# Patient Record
Sex: Male | Born: 1970 | Race: White | Hispanic: No | Marital: Married | Smoking: Former smoker
Health system: Southern US, Community
[De-identification: ages and names within clinical notes are randomized; demographics above are authoritative.]

## PROBLEM LIST (undated history)

## (undated) DIAGNOSIS — E785 Hyperlipidemia, unspecified: Secondary | ICD-10-CM

## (undated) DIAGNOSIS — N529 Male erectile dysfunction, unspecified: Secondary | ICD-10-CM

## (undated) DIAGNOSIS — H269 Unspecified cataract: Secondary | ICD-10-CM

## (undated) HISTORY — DX: Male erectile dysfunction, unspecified: N52.9

## (undated) HISTORY — DX: Unspecified cataract: H26.9

## (undated) HISTORY — DX: Hyperlipidemia, unspecified: E78.5

## (undated) HISTORY — PX: WISDOM TOOTH EXTRACTION: SHX21

## (undated) HISTORY — PX: VASECTOMY: SHX75

## (undated) HISTORY — PX: EYE SURGERY: SHX253

---

## 2003-07-03 ENCOUNTER — Encounter: Admission: RE | Admit: 2003-07-03 | Discharge: 2003-07-03 | Payer: Self-pay | Admitting: Gastroenterology

## 2004-07-09 ENCOUNTER — Encounter: Admission: RE | Admit: 2004-07-09 | Discharge: 2004-07-09 | Payer: Self-pay | Admitting: Thoracic Surgery

## 2004-07-17 ENCOUNTER — Ambulatory Visit (HOSPITAL_COMMUNITY): Admission: RE | Admit: 2004-07-17 | Discharge: 2004-07-17 | Payer: Self-pay | Admitting: Thoracic Surgery

## 2004-08-14 ENCOUNTER — Ambulatory Visit (HOSPITAL_COMMUNITY): Admission: RE | Admit: 2004-08-14 | Discharge: 2004-08-14 | Payer: Self-pay | Admitting: Thoracic Surgery

## 2004-09-17 ENCOUNTER — Ambulatory Visit (HOSPITAL_COMMUNITY): Admission: RE | Admit: 2004-09-17 | Discharge: 2004-09-17 | Payer: Self-pay | Admitting: Gastroenterology

## 2005-02-10 ENCOUNTER — Encounter: Admission: RE | Admit: 2005-02-10 | Discharge: 2005-02-10 | Payer: Self-pay | Admitting: Gastroenterology

## 2006-05-18 HISTORY — PX: ESOPHAGEAL DILATION: SHX303

## 2007-01-17 IMAGING — RF DG ESOPHAGUS
8 of 10 series · 14 of 24 positions shown · non-contrast
Comparison: 07/03/03.

CLINICAL DATA: Dysphagia/history of stricture. 
BARIUM SWALLOW:

[Series 1: run · 1 of 1 slices shown (1 of 8)]
[im 1/1]
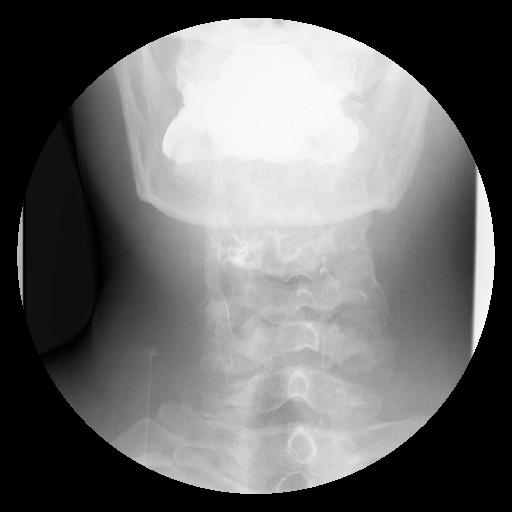

[Series 2: run · 2 of 8 slices shown (2 of 8)]
[im 3/8]
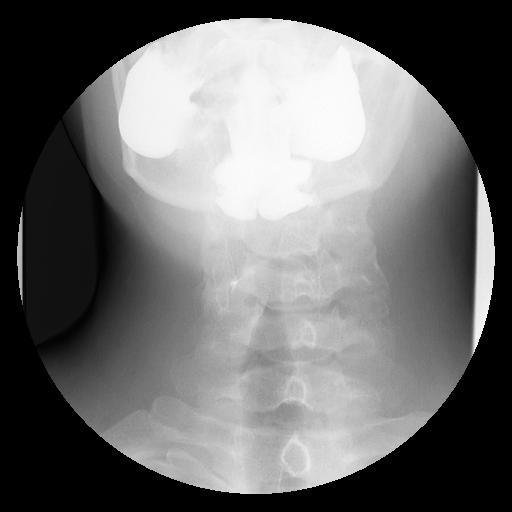
[im 8/8]
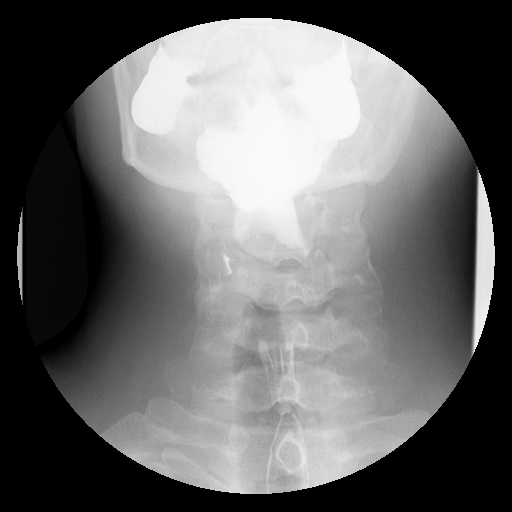

[Series 3: run · 3 of 12 slices shown (3 of 8)]
[im 3/12]
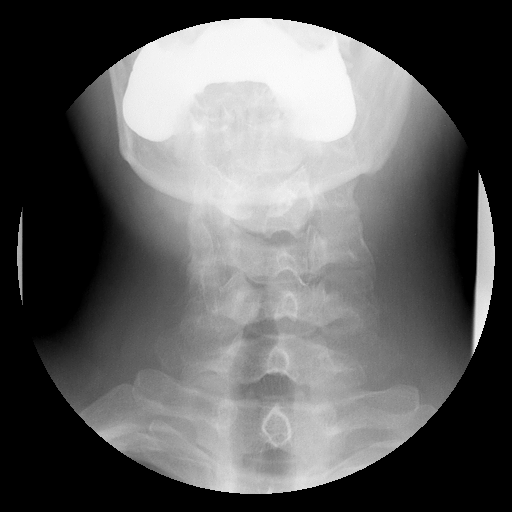
[im 5/12]
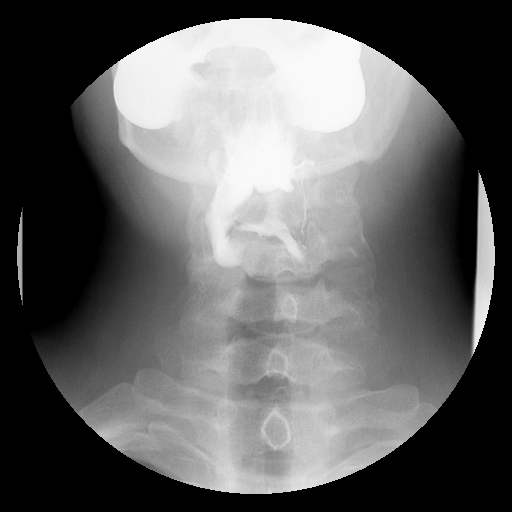
[im 9/12]
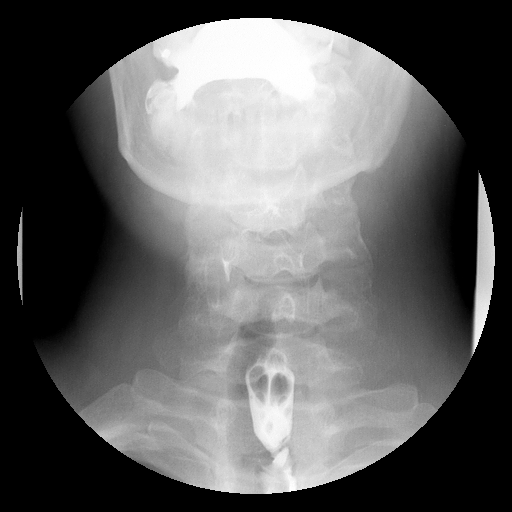

[Series 5: run · 4 of 11 slices shown (4 of 8)]
[im 1/11]
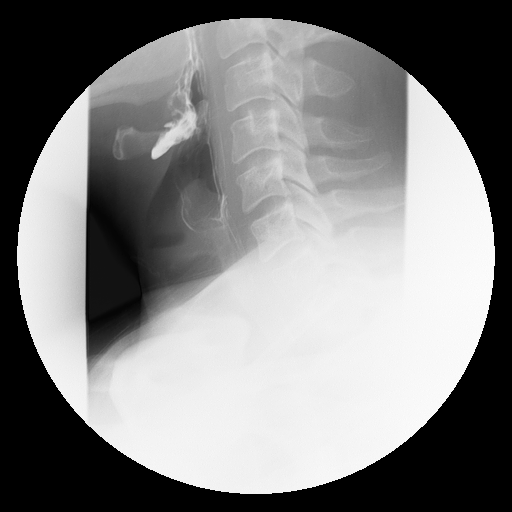
[im 2/11]
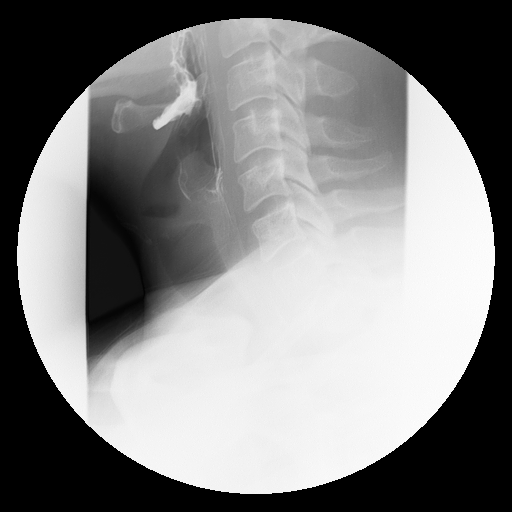
[im 6/11]
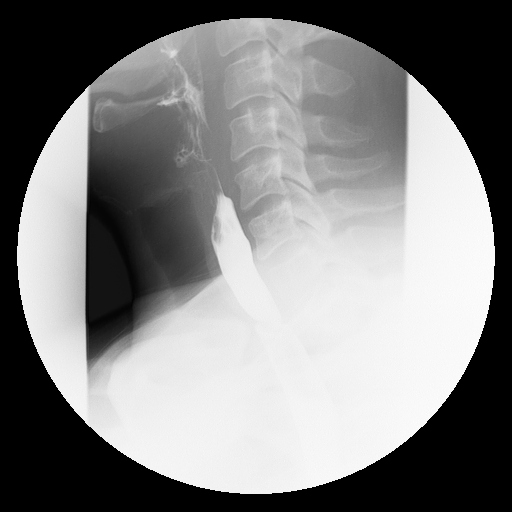
[im 9/11]
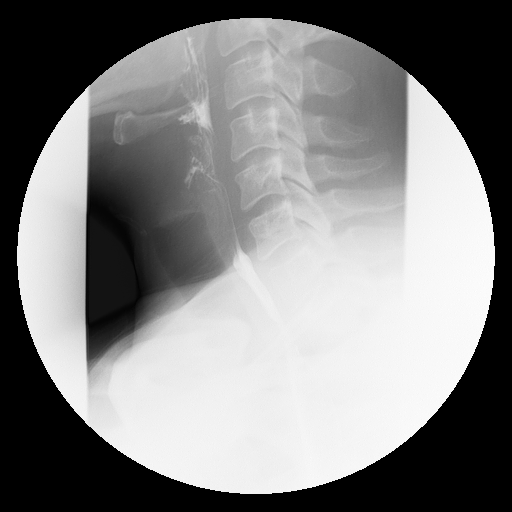

[Series 6: run · 1 of 1 slices shown (5 of 8)]
[im 1/1]
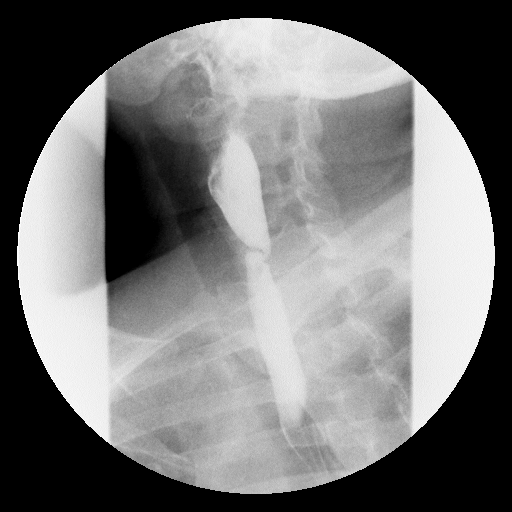

[Series 7: run · 1 of 2 slices shown (6 of 8)]
[im 1/2]
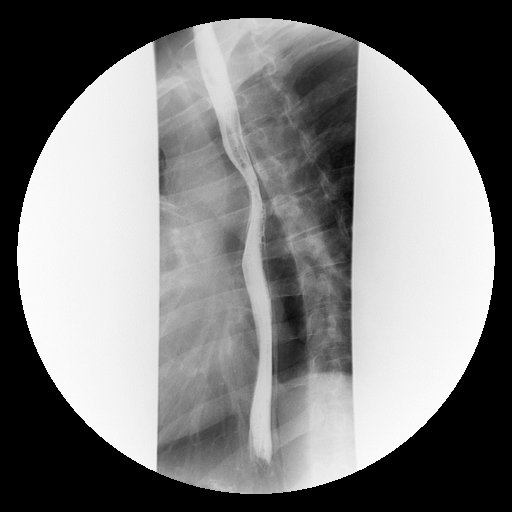

[Series 9: run · 1 of 1 slices shown (7 of 8)]
[im 1/1]
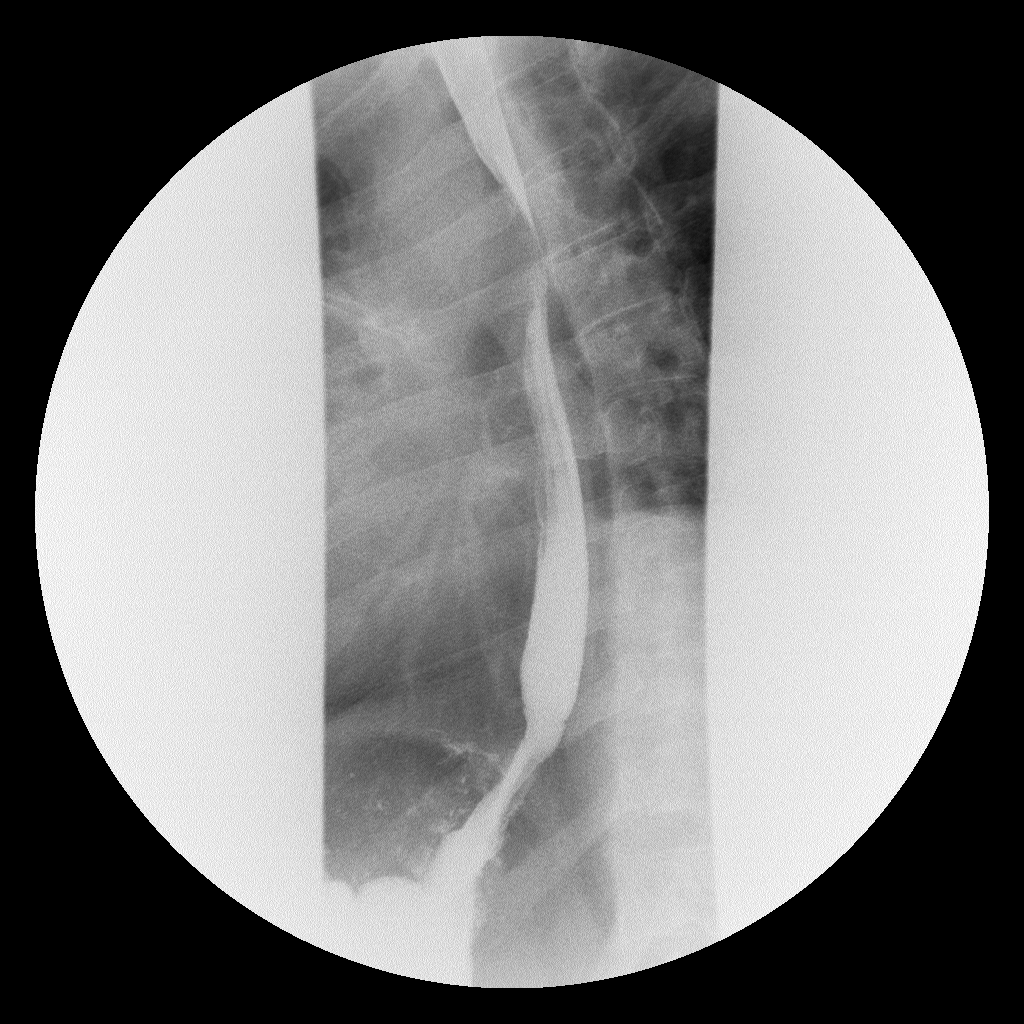

[Series 11: run · 1 of 1 slices shown (8 of 8)]
[im 1/1]
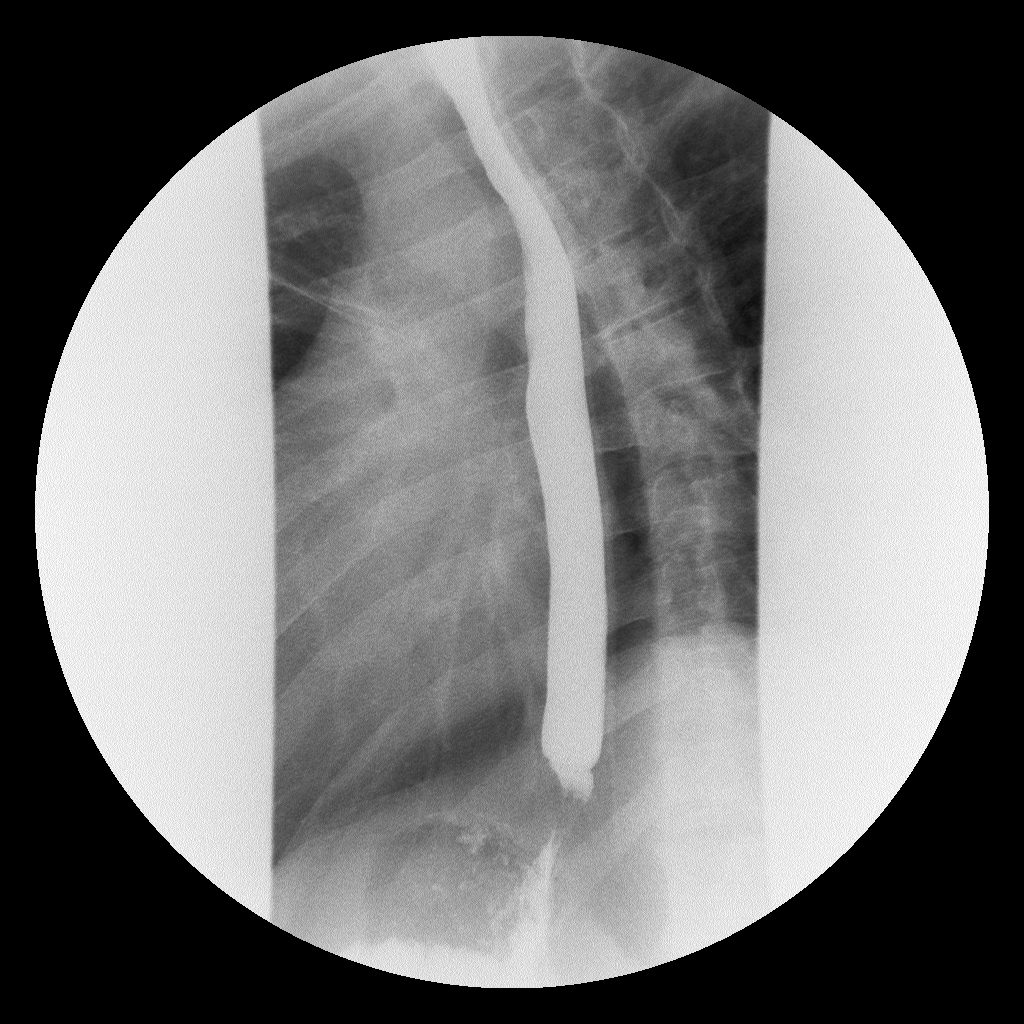

[14 of 24 positions shown; findings below may reference images not displayed]

The patient was studied upright and flat.  4 per second spot films were obtained in the cervical region in the AP and lateral projections. 
Again noted is a rather tight stenosis at the cervicothoracic junction, without change compared to the prior study.  Also again noted is a web in the anterior cervical esophagus at the level of C-5.  No definite change when compared to the prior study.  The cervical esophagus is mildly dilated above the narrowing ,as before. There is a distal esophageal ring that I think actually might be a Schatzki?s ring related to a small hiatal hernia.  No reflux, stricture, or esophagitis.
IMPRESSION: 1.  Tight web-like stenosis at the cervicothoracic junction ? no change. 
2.  Anterior web at C-5, and probable Schatzki?s ring distally ? no change.

## 2007-07-15 ENCOUNTER — Encounter: Payer: Self-pay | Admitting: Internal Medicine

## 2007-07-21 ENCOUNTER — Telehealth: Payer: Self-pay | Admitting: Internal Medicine

## 2007-07-26 ENCOUNTER — Ambulatory Visit: Payer: Self-pay | Admitting: Internal Medicine

## 2007-07-26 DIAGNOSIS — H3560 Retinal hemorrhage, unspecified eye: Secondary | ICD-10-CM | POA: Insufficient documentation

## 2007-07-26 DIAGNOSIS — E785 Hyperlipidemia, unspecified: Secondary | ICD-10-CM | POA: Insufficient documentation

## 2007-07-26 LAB — CONVERTED CEMR LAB
AST: 24 units/L (ref 0–37)
Albumin: 4.3 g/dL (ref 3.5–5.2)
Basophils Absolute: 0 10*3/uL (ref 0.0–0.1)
Bilirubin, Direct: 0.1 mg/dL (ref 0.0–0.3)
CRP, High Sensitivity: 2 (ref 0.00–5.00)
Calcium: 9.7 mg/dL (ref 8.4–10.5)
Chloride: 103 meq/L (ref 96–112)
Eosinophils Absolute: 0.3 10*3/uL (ref 0.0–0.6)
GFR calc Af Amer: 96 mL/min
GFR calc non Af Amer: 79 mL/min
Glucose, Bld: 98 mg/dL (ref 70–99)
Lymphocytes Relative: 31.1 % (ref 12.0–46.0)
MCHC: 33.6 g/dL (ref 30.0–36.0)
MCV: 89.3 fL (ref 78.0–100.0)
Neutro Abs: 3.3 10*3/uL (ref 1.4–7.7)
Neutrophils Relative %: 54.1 % (ref 43.0–77.0)
Platelets: 285 10*3/uL (ref 150–400)
RBC: 4.72 M/uL (ref 4.22–5.81)
Sodium: 140 meq/L (ref 135–145)

## 2007-08-03 ENCOUNTER — Ambulatory Visit: Payer: Self-pay

## 2007-08-03 ENCOUNTER — Encounter: Payer: Self-pay | Admitting: Internal Medicine

## 2007-08-08 ENCOUNTER — Ambulatory Visit: Payer: Self-pay

## 2007-08-08 ENCOUNTER — Encounter: Payer: Self-pay | Admitting: Internal Medicine

## 2007-08-30 ENCOUNTER — Ambulatory Visit: Payer: Self-pay | Admitting: Internal Medicine

## 2007-10-05 ENCOUNTER — Ambulatory Visit: Payer: Self-pay | Admitting: Internal Medicine

## 2007-10-05 LAB — CONVERTED CEMR LAB
Cholesterol: 218 mg/dL (ref 0–200)
HDL: 28.8 mg/dL — ABNORMAL LOW (ref >39.0)
Triglycerides: 140 mg/dL (ref 0–149)
VLDL: 28 mg/dL (ref 0–40)

## 2007-11-17 ENCOUNTER — Telehealth: Payer: Self-pay | Admitting: Internal Medicine

## 2007-11-22 ENCOUNTER — Telehealth: Payer: Self-pay | Admitting: Internal Medicine

## 2008-03-19 ENCOUNTER — Encounter (INDEPENDENT_AMBULATORY_CARE_PROVIDER_SITE_OTHER): Payer: Self-pay

## 2008-03-21 ENCOUNTER — Telehealth: Payer: Self-pay | Admitting: Internal Medicine

## 2010-02-21 ENCOUNTER — Telehealth: Payer: Self-pay | Admitting: Internal Medicine

## 2010-03-24 ENCOUNTER — Encounter: Payer: Self-pay | Admitting: Internal Medicine

## 2010-05-22 ENCOUNTER — Telehealth: Payer: Self-pay

## 2010-06-08 ENCOUNTER — Encounter: Payer: Self-pay | Admitting: Anesthesiology

## 2010-06-09 ENCOUNTER — Telehealth (INDEPENDENT_AMBULATORY_CARE_PROVIDER_SITE_OTHER): Payer: Self-pay | Admitting: *Deleted

## 2010-06-17 NOTE — Progress Notes (Signed)
Summary: referal - urology  Phone Note Call from Patient   Caller: Patient Call For: Anthony Savers  MD Summary of Call: took call from pt - requesting referral for doctor to eval for vasectomy. they have 1 daughter , wife's gyn will not put her on birthcontrol due to smoker , is willing to discuss options. pt is also willing to do rov here if needed. KIK call back [phone] Initial call taken by: [name],  February 21, 2010 1:10 PM  Follow-up for Phone Call        urology referral Follow-up by: Anthony Savers  MD,  February 24, 2010 9:46 AM  New Problems: STERILIZATION (ICD-V25.2)   New Problems: STERILIZATION (ICD-V25.2)

## 2010-06-17 NOTE — Letter (Signed)
Summary: [facility]  [facility]   Imported By: Maryln Gottron 04/01/2010 10:15:11  _____________________________________________________________________  External Attachment:    Type:   Image     Comment:   External Document

## 2010-06-19 NOTE — Progress Notes (Signed)
Summary: need records  Phone Note Call from Patient   Caller: Patient Call For: Gordy Savers  MD Summary of Call: needing records for inurance reasons - last seen 2009 - instructed to come by and sign release of info and let us know where it needs to go to ins or release to pt. to handle.  Initial call taken by: [name],  May 22, 2010 10:58 AM

## 2010-06-19 NOTE — Progress Notes (Signed)
  pt signed ROI over @ [facility], it was sent to me via Fax, i have interofficed to [facility] pt needs 5years of records. Anthony Ayers  June 09, 2010 1:21 PM

## 2010-10-03 NOTE — Op Note (Signed)
NAME:  DORNELL, GRASMICK NO.:  [id number]   MEDICAL RECORD NO.:  [id number]          PATIENT TYPE:  OIB   LOCATION:  2899                         FACILITY:  [facility]   PHYSICIAN:  Ines Bloomer, M.D. DATE OF BIRTH:  03-Dec-1970   DATE OF PROCEDURE:  08/14/2004  DATE OF DISCHARGE:                                 OPERATIVE REPORT   PREOPERATIVE DIAGNOSIS:  Cervical esophageal web.   POSTOPERATIVE DIAGNOSIS:  Cervical esophageal web.   OPERATION PERFORMED:  Esophageal dilatation with esophagoscopy, attempted  esophagogastroscopy.   SURGEON:  Ines Bloomer, M.D.   General anesthesia.   The rigid esophagoscope was passed through the introitus, and over that was  passed an 30 [place] leather-tip and then by the 18 [place] leather-tip was  passed a guidewire under fluoroscopy down to th stomach.  Over the guidewire  was passed 8, 11, 12 and 14 French Savary dilators.  After the 8 had been  passed, the rigid scope was removed and all these other guidewires were  passed under direct fluoroscopic guidance.  Dr. Randa Evens attempted to pass  the flexible gastroscope but could not get past the introitus because of the  endotracheal tube, so this was terminated.  The patient was  then returned  to the recovery room in stable condition.      DPB/MEDQ  D:  08/14/2004  T:  08/14/2004  Job:  [id number]   cc:   Fayrene Fearing L. Malon Kindle., M.D.  [address], [address]  [place]  [place] [address]  Fax: [phone]

## 2010-10-03 NOTE — Op Note (Signed)
NAME:  Anthony Ayers, Anthony Ayers NO.:  [id number]   MEDICAL RECORD NO.:  [id number]          PATIENT TYPE:  OIB   LOCATION:  2899                         FACILITY:  [facility]   PHYSICIAN:  Ines Bloomer, M.D. DATE OF BIRTH:  [birth year]   DATE OF PROCEDURE:  07/17/2004  DATE OF DISCHARGE:                                 OPERATIVE REPORT   PREOPERATIVE DIAGNOSIS:  Cervical esophageal web.   POSTOPERATIVE DIAGNOSIS:  Cervical esophageal web.   OPERATION PERFORMED:  Esophagoscopy and dilatation.   SURGEON:  Ines Bloomer, M.D. and Llana Aliment. Randa Evens, M.D.   ANESTHESIA:  General.   DESCRIPTION OF PROCEDURE:  After general anesthesia, the rigid esophagoscope  was passed through the introitus of the esophagus and just immediately  through the introitus, there was an area of narrow not more than 1 cm or 2.  It was eccentric with the area going more posteriorly.  We were able to pass  an 18 French leather tip dilator two and it was very tight.  Then using the  leather tip dilators, we dilated up to 24 [place] and you could then see the  opening of the distal esophagus.  Through that was then passed a guidewire  under fluoroscopy to the stomach and once the guidewire was in the stomach,  we then passed Savary dilators up to a 30 [place].  We passed a 7, 8, 9 and  10 Savary dilators.  After that was done, the rigid esophagoscope was  removed after passing the 7 and 8 Savary dilators and the other two were  passed over the guidewire without difficulty.  We then attempted to the pass  the endoscope and the endoscope could only get into the introitus.  A  picture was taken of that and the patient was then returned to recovery in  stable condition.      DPB/MEDQ  D:  07/17/2004  T:  07/17/2004  Job:  [id number]   cc:   Fayrene Fearing L. Malon Kindle., M.D.  [address], [address]  [place]  [place] [address]  Fax: [phone]

## 2010-10-03 NOTE — Op Note (Signed)
NAME:  Anthony Ayers, Anthony Ayers NO.:  [id number]   MEDICAL RECORD NO.:  [id number]          PATIENT TYPE:  OIB   LOCATION:  2899                         FACILITY:  [facility]   PHYSICIAN:  Fayrene Fearing L. Malon Kindle., M.D.DATE OF BIRTH:  [birth year]   DATE OF PROCEDURE:  08/14/2004  DATE OF DISCHARGE:                                 OPERATIVE REPORT   PROCEDURE:  Attempted endoscopy.   ENDOSCOPIST:  Llana Aliment. Randa Evens, M.D.   MEDICATIONS:  Per anesthesia.   HISTORY:  The patient has a proximal esophageal stricture which was dilated  up to 27- to 30-French on last time and comes back to have it dilated again.  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   DESCRIPTION OF PROCEDURE:  Procedure was explained to the patient by Dr.  Edwyna Shell and we talked to him beforehand.  After anesthesia had been induced,  a direct esophagoscopy with a rigid scope was performed by  Dr. Edwyna Shell and a  guidewire placed into the esophagus and Savary dilators then passed over the  guidewire.  The largest dilator at that point was 39-French.  We left the  guidewire in place and I attempted to pass the endoscope.  I followed the  wire down and got to the introitus with the endotracheal tube and the wire  there and despite actually seeing the wire, I was unable to pass it past the  endotracheal tube and even with the cuff down.  Due to the proximal nature  of the stricture, I was concerned about perforating.  We tried to thread the  scope over the wire, but were unable to get the wire to come out the channel  and then we decided at that point to terminate the procedure.  We did pass  another dilator, a 42-French, with some heme on that dilator.  At this  point, I discussed this with Dr. Edwyna Shell and we felt the best thing to do  would be to let him get over this and bring him back as an outpatient under  light sedation without an endotracheal tube and general anesthesia and  attempt to  pass a small endoscope under those circumstances to  endoscopically visualize this stricture.  The patient went to the recovery  room in good condition.  There were no immediate complications.  Barium  swallow will be obtained.   ASSESSMENT:  Proximal esophageal stricture dilated to 42-French, but unable  to pass an endoscope following the dilation.   PLAN:  We will bring the patient back at a later date to arrange an  endoscopy to visualize this area.     JLE/MEDQ  D:  08/14/2004  T:  08/14/2004  Job:  [id number]   cc:   Ines Bloomer, M.D.  [address]  [place]  [place] [address]

## 2010-10-03 NOTE — Op Note (Signed)
NAME:  ALGIS, LEHENBAUER NO.:  [id number]   MEDICAL RECORD NO.:  [id number]          PATIENT TYPE:  OIB   LOCATION:  2899                         FACILITY:  [facility]   PHYSICIAN:  Fayrene Fearing L. Malon Kindle., M.D.DATE OF BIRTH:  February 15, 1971   DATE OF PROCEDURE:  07/17/2004  DATE OF DISCHARGE:                                 OPERATIVE REPORT   PROCEDURE PERFORMED:  Esophagoscopy and Savary dilatation done in operating  room.   MEDICATIONS:  Per anesthesia.   ENDOSCOPIST:  Llana Aliment. Randa Evens, M.D.   INDICATIONS FOR PROCEDURE:  The patient is a nice 39 year old with proximal  esophageal stricture web that has caused severe dysphagia, has gotten  progressively worse.  This procedure is done in conjunction with Dr. Edwyna Shell  to attempt to dilate the web.   DESCRIPTION OF PROCEDURE:  Dr. Edwyna Shell performed a rigid esophagoscopy and  dilation with the small bougies through the rigid esophagoscope.  We then  passed a Savary guidewire through the esophagoscope and using fluoroscopic  guidance, watched it go down into the stomach.  Then passed several  dilators.  We dilated, I started off with a 21 to [place] and passed a 30  [place].  There was heme at all the dilators.  There was some pressure  resistance with a 30 [place] and the Dilator passed freely.  I then ran the  endoscope along side the guidewire with help from Dr. Edwyna Shell with  laryngoscope, we were able to actually visualize the stricture.  It appeared  smooth and even there was slight increased bleeding but no gross bleeding  and ulceration.  This was photographed.  Even with gentle pressure the scope  did not want to pass through the stricture, so we did not push it.  The  scope and guidewire were then withdrawn.   ASSESSMENT:  Esophageal stricture and web dilated to 30 [place].   PLAN:  We will likely get a post procedure swallow and would like to repeat  the procedure in several weeks per Dr. Edwyna Shell.  The procedure had been  explained to the patient and consent obtained.  With  the patient in left lateral decubitus position, the [employer] scope was  inserted and advanced under direct visualization.  The stomach was entered.  The pylorus identified and passed.  The duodenum including the bulb and  second portion were seen well and were unremarkable.  The scope was  withdrawn back into the stomach.  The antrum and body were normal.  The  scope was withdrawn.  The patient tolerated the procedure well and was  maintained on low flow oxygen and pulse oximeter throughout the procedure.   ASSESSMENT:   PLAN:      JLE/MEDQ  D:  07/17/2004  T:  07/17/2004  Job:  [id number]   cc:   Ines Bloomer, M.D.  [address]  [place]  [place] [address]   Leanne Chang, M.D.  [address]  [place]  [place] [address]  Fax: [phone]

## 2010-10-03 NOTE — Op Note (Signed)
NAME:  Anthony Ayers, ZEMAN               ACCOUNT NO.:  [id number]   MEDICAL RECORD NO.:  [id number]          PATIENT TYPE:  AMB   LOCATION:  ENDO                         [facility]ACILITY[facility]   PHYSICIAN:  James L. Malon Kindle., M.D.DATE OF BIRTH:  [birth year]   DATE OF PROCEDURE:  09/17/2004  DATE OF DISCHARGE:                                 OPERATIVE REPORT   PROCEDURE:  Attempted esophagoscopy and dilation.   MEDICATIONS:  Fentanyl 50 mcg, Versed 5 mg IV.   INDICATIONS:  A very nice young man with a proximal esophageal stricture  web.  In fact, he has had multiple webs, has been dilated up to 24 [place] of  the proximal stricture under general anesthesia by Dr. Edwyna Shell.  We were  unable to pass the endoscope.  He has been swallowing much better and this  is done with an attempt to evaluate these webs and stricture endoscopically.   DESCRIPTION OF PROCEDURE:  The procedure had been explained to the patient  including the potential risks and benefits and consent obtained.  We  obtained a loaner pediatric endoscope from [employer] however, the adaptor on  the equipment today loaned [place] would not fit any of our light sources here  and we were unable to use it.  The adult scope was passed.  I passed it  under direct visualization, trying to pass it blindly.  We got down, after  passing approximately 3-4 cm into the proximal esophagus to a smooth  stricture.  Every time the patient breathed, moved or coughed, we would fall  back out to the introitus to the level of the vocal cords.  I could not keep  the scope in position well enough to pass the guide wire nor would the adult  scope pass through this area.  At this point, I felt that it was in his best  interest not to proceed further.   ASSESSMENT:  Esophageal stricture/web in the very proximal esophagus,  approximately 3-4 cm below the introitus, unable to maintain position or  pass an adult scope.  530.3.  We will discuss this further with Dr.  Edwyna Shell  and the patient.  I think the options are to try to dilate him again under  anesthesia.  Will wait until functional pediatric scope is obtained and try  to repeat this at that time.      JLE/MEDQ  D:  09/17/2004  T:  09/17/2004  Job:  [id number]   cc:   Ines Bloomer, M.D.  [address]  [place]  [place] [address]

## 2011-06-09 ENCOUNTER — Ambulatory Visit (INDEPENDENT_AMBULATORY_CARE_PROVIDER_SITE_OTHER): Payer: BC Managed Care – PPO

## 2011-06-09 DIAGNOSIS — J40 Bronchitis, not specified as acute or chronic: Secondary | ICD-10-CM

## 2011-06-09 DIAGNOSIS — R5381 Other malaise: Secondary | ICD-10-CM

## 2011-06-09 DIAGNOSIS — R05 Cough: Secondary | ICD-10-CM

## 2011-06-09 DIAGNOSIS — R059 Cough, unspecified: Secondary | ICD-10-CM

## 2014-05-08 ENCOUNTER — Ambulatory Visit (INDEPENDENT_AMBULATORY_CARE_PROVIDER_SITE_OTHER): Payer: No Typology Code available for payment source | Admitting: Physician Assistant

## 2014-05-08 VITALS — BP 110/74 | HR 92 | Temp 98.4°F | Resp 16 | Ht 71.0 in | Wt 200.0 lb

## 2014-05-08 DIAGNOSIS — J209 Acute bronchitis, unspecified: Secondary | ICD-10-CM

## 2014-05-08 MED ORDER — AZITHROMYCIN 200 MG/5ML PO SUSR: ORAL | Status: DC

## 2014-05-08 MED ORDER — AZITHROMYCIN 250 MG PO TABS: ORAL_TABLET | ORAL | Status: DC

## 2014-05-08 MED ORDER — ALBUTEROL SULFATE HFA 108 (90 BASE) MCG/ACT IN AERS: 2.0000 | INHALATION_SPRAY | RESPIRATORY_TRACT | Status: DC | PRN

## 2014-05-08 NOTE — Patient Instructions (Signed)
Take antibiotic until finished. Use albuterol if having problems breathing or wheezing. Continue home mucinex. Drink 64 oz water a day with this. Return in 7-10 days if no improvement in your symptoms

## 2014-05-08 NOTE — Progress Notes (Signed)
   Subjective:    Patient ID: Anthony Ayers, male    DOB: June 03, 1970, 43 y.o.   MRN: [id number]  HPI  This is a 43 year old male presenting with chest congestion x 3 weeks. The cough is productive of green sputum, especially in the mornings. He feels the cough has gotten somewhat better but just won't go away. Today he started feeling fatigued and achy. He is noticing he is wheezing - no history of asthma. His daughter is sick with a similar illness. He has tried mucinex which has helped some. He denies fever, chills, otalgia, nasal congestion, sore throat, abdominal pain, SOB. He is a social smoker.   Review of Systems  Patient Active Problem List   Diagnosis Date Noted  . HYPERLIPIDEMIA 07/26/2007  . RETINAL HEMORRHAGE 07/26/2007   Prior to Admission medications   Not on File   Allergies  Allergen Reactions  . Acetaminophen Rash  . Ibuprofen Rash   Patient's social and family history were reviewed.     Objective:   Physical Exam  Constitutional: He is oriented to person, place, and time. He appears well-developed and well-nourished. No distress.  HENT:  Head: Normocephalic and atraumatic.  Right Ear: Hearing, tympanic membrane, external ear and ear canal normal.  Left Ear: Hearing, tympanic membrane, external ear and ear canal normal.  Nose: Nose normal.  Mouth/Throat: Uvula is midline, oropharynx is clear and moist and mucous membranes are normal.  Eyes: Conjunctivae and lids are normal. Right eye exhibits no discharge. Left eye exhibits no discharge. No scleral icterus.  Cardiovascular: Normal rate, regular rhythm, normal heart sounds, intact distal pulses and normal pulses.   No murmur heard. Pulmonary/Chest: Effort normal and breath sounds normal. No respiratory distress. He has no wheezes. He has no rhonchi. He has no rales.  Musculoskeletal: Normal range of motion.  Lymphadenopathy:       Head (right side): No submental, no submandibular, no tonsillar, no  preauricular, no posterior auricular and no occipital adenopathy present.       Head (left side): No submental, no submandibular, no tonsillar, no preauricular, no posterior auricular and no occipital adenopathy present.    He has no cervical adenopathy.  Neurological: He is alert and oriented to person, place, and time.  Skin: Skin is warm, dry and intact. No lesion and no rash noted.  Psychiatric: He has a normal mood and affect. His speech is normal and behavior is normal. Thought content normal.      Assessment & Plan:  1. Acute bronchitis, unspecified organism Due to 3 week duration of a productive cough, will treat with abx. Prescribed albuterol for use with wheezing. He will continue with home mucinex. Will return in 7-10 days if symptoms worsen or fail to improve.  - albuterol (PROVENTIL HFA;VENTOLIN HFA) 108 (90 BASE) MCG/ACT inhaler; Inhale 2 puffs into the lungs every 4 (four) hours as needed for wheezing or shortness of breath (cough, shortness of breath or wheezing.).  Dispense: 1 Inhaler; Refill: 1 - azithromycin (ZITHROMAX) 250 MG tablet; Take 2 tabs PO x 1 dose, then 1 tab PO QD x 4 days  Dispense: 6 tablet; Refill: 0   Benjaman Pott. Drenda Freeze, MHS [facility]  05/08/2014

## 2016-11-24 ENCOUNTER — Encounter: Payer: Self-pay | Admitting: Family Medicine

## 2016-11-24 ENCOUNTER — Ambulatory Visit (INDEPENDENT_AMBULATORY_CARE_PROVIDER_SITE_OTHER): Payer: PRIVATE HEALTH INSURANCE | Admitting: Family Medicine

## 2016-11-24 VITALS — BP 130/88 | HR 65 | Temp 98.1°F | Ht 71.0 in | Wt 194.0 lb

## 2016-11-24 DIAGNOSIS — Z0000 Encounter for general adult medical examination without abnormal findings: Secondary | ICD-10-CM | POA: Diagnosis not present

## 2016-11-24 DIAGNOSIS — Z8719 Personal history of other diseases of the digestive system: Secondary | ICD-10-CM | POA: Insufficient documentation

## 2016-11-24 NOTE — Patient Instructions (Signed)
WE NOW OFFER   Anthony Ayers's FAST TRACK!!!  SAME DAY Appointments for ACUTE CARE  Such as: Sprains, Injuries, cuts, abrasions, rashes, muscle pain, joint pain, back pain Colds, flu, sore throats, headache, allergies, cough, fever  Ear pain, sinus and eye infections Abdominal pain, nausea, vomiting, diarrhea, upset stomach Animal/insect bites  3 Easy Ways to Schedule: Walk-In Scheduling Call in scheduling Mychart Sign-up: [email or web address]         

## 2016-11-24 NOTE — Progress Notes (Signed)
   Subjective:    Patient ID: Genia Del, male    DOB: 1971/05/04, 46 y.o.   MRN: [id number]  HPI 46 yr old male to re-establish with [place] after a 7 year absence and also for a well exam. He feels fine and has no complaints. He does ask for a referral to see a Dermatologist for a general skin exam. He tries to eat a healthy diet and he exercises 5 days a week. He works as a Secondary school teacher.    Review of Systems  Constitutional: Negative.   HENT: Negative.   Eyes: Negative.   Respiratory: Negative.   Cardiovascular: Negative.   Gastrointestinal: Negative.   Genitourinary: Negative.   Musculoskeletal: Negative.   Skin: Negative.   Neurological: Negative.   Psychiatric/Behavioral: Negative.        Objective:   Physical Exam  Constitutional: He is oriented to person, place, and time. He appears well-developed and well-nourished. No distress.  HENT:  Head: Normocephalic and atraumatic.  Right Ear: External ear normal.  Left Ear: External ear normal.  Nose: Nose normal.  Mouth/Throat: Oropharynx is clear and moist. No oropharyngeal exudate.  Eyes: Conjunctivae and EOM are normal. Pupils are equal, round, and reactive to light. Right eye exhibits no discharge. Left eye exhibits no discharge. No scleral icterus.  Neck: Neck supple. No JVD present. No tracheal deviation present. No thyromegaly present.  Cardiovascular: Normal rate, regular rhythm, normal heart sounds and intact distal pulses.  Exam reveals no gallop and no friction rub.   No murmur heard. Pulmonary/Chest: Effort normal and breath sounds normal. No respiratory distress. He has no wheezes. He has no rales. He exhibits no tenderness.  Abdominal: Soft. Bowel sounds are normal. He exhibits no distension and no mass. There is no tenderness. There is no rebound and no guarding.  Genitourinary: Penis normal. No penile tenderness.  Musculoskeletal: Normal range of motion. He exhibits no edema or tenderness.    Lymphadenopathy:    He has no cervical adenopathy.  Neurological: He is alert and oriented to person, place, and time. He has normal reflexes. No cranial nerve deficit. He exhibits normal muscle tone. Coordination normal.  Skin: Skin is warm and dry. No rash noted. He is not diaphoretic. No erythema. No pallor.  Psychiatric: He has a normal mood and affect. His behavior is normal. Judgment and thought content normal.          Assessment & Plan:  Well exam. We discussed diet and exercise. Set up fasting labs soon. Refer to Dermatology.  Alysia Penna, MD

## 2016-11-25 ENCOUNTER — Encounter: Payer: Self-pay | Admitting: Family Medicine

## 2016-11-30 ENCOUNTER — Other Ambulatory Visit (INDEPENDENT_AMBULATORY_CARE_PROVIDER_SITE_OTHER): Payer: PRIVATE HEALTH INSURANCE

## 2016-11-30 DIAGNOSIS — Z0000 Encounter for general adult medical examination without abnormal findings: Secondary | ICD-10-CM

## 2016-11-30 LAB — CBC WITH DIFFERENTIAL/PLATELET
BASOS ABS: 0.1 10*3/uL (ref 0.0–0.1)
Basophils Relative: 1.3 % (ref 0.0–3.0)
Eosinophils Absolute: 0.4 10*3/uL (ref 0.0–0.7)
Eosinophils Relative: 7.7 % — ABNORMAL HIGH (ref 0.0–5.0)
HEMATOCRIT: 40.6 % (ref 39.0–52.0)
Hemoglobin: 13.8 g/dL (ref 13.0–17.0)
LYMPHS PCT: 37 % (ref 12.0–46.0)
Lymphs Abs: 1.9 10*3/uL (ref 0.7–4.0)
MCHC: 33.9 g/dL (ref 30.0–36.0)
MCV: 90 fl (ref 78.0–100.0)
MONOS PCT: 8.5 % (ref 3.0–12.0)
Monocytes Absolute: 0.4 10*3/uL (ref 0.1–1.0)
NEUTROS ABS: 2.3 10*3/uL (ref 1.4–7.7)
Neutrophils Relative %: 45.5 % (ref 43.0–77.0)
Platelets: 259 10*3/uL (ref 150.0–400.0)
RBC: 4.51 Mil/uL (ref 4.22–5.81)
RDW: 12.8 % (ref 11.5–15.5)
WBC: 5.1 10*3/uL (ref 4.0–10.5)

## 2016-11-30 LAB — HEPATIC FUNCTION PANEL
ALK PHOS: 39 U/L (ref 39–117)
ALT: 13 U/L (ref 0–53)
AST: 19 U/L (ref 0–37)
Albumin: 4.4 g/dL (ref 3.5–5.2)
BILIRUBIN DIRECT: 0.1 mg/dL (ref 0.0–0.3)
BILIRUBIN TOTAL: 0.4 mg/dL (ref 0.2–1.2)
Total Protein: 6.6 g/dL (ref 6.0–8.3)

## 2016-11-30 LAB — BASIC METABOLIC PANEL
BUN: 8 mg/dL (ref 6–23)
CO2: 30 mEq/L (ref 19–32)
Calcium: 9.5 mg/dL (ref 8.4–10.5)
Chloride: 103 mEq/L (ref 96–112)
Creatinine, Ser: 0.81 mg/dL (ref 0.40–1.50)
GFR: 107.88 mL/min (ref >60.00)
Glucose, Bld: 97 mg/dL (ref 70–99)
POTASSIUM: 4 meq/L (ref 3.5–5.1)
SODIUM: 140 meq/L (ref 135–145)

## 2016-11-30 LAB — POC URINALSYSI DIPSTICK (AUTOMATED)
BILIRUBIN UA: NEGATIVE
Blood, UA: NEGATIVE
Glucose, UA: NEGATIVE
Ketones, UA: NEGATIVE
LEUKOCYTES UA: NEGATIVE
NITRITE UA: NEGATIVE
PH UA: 6 (ref 5.0–8.0)
PROTEIN UA: NEGATIVE
Spec Grav, UA: <=1.005 — AB (ref 1.010–1.025)
Urobilinogen, UA: 0.2 E.U./dL

## 2016-11-30 LAB — LIPID PANEL
CHOL/HDL RATIO: 4
Cholesterol: 200 mg/dL (ref 0–200)
HDL: 47.5 mg/dL (ref >39.00)
LDL CALC: 123 mg/dL — AB (ref 0–99)
NONHDL: 152.64
Triglycerides: 149 mg/dL (ref 0.0–149.0)
VLDL: 29.8 mg/dL (ref 0.0–40.0)

## 2016-11-30 LAB — TSH: TSH: 1.69 u[IU]/mL (ref 0.35–4.50)

## 2017-02-04 ENCOUNTER — Encounter: Payer: Self-pay | Admitting: Family Medicine

## 2017-12-02 ENCOUNTER — Ambulatory Visit (INDEPENDENT_AMBULATORY_CARE_PROVIDER_SITE_OTHER): Payer: PRIVATE HEALTH INSURANCE | Admitting: Family Medicine

## 2017-12-02 ENCOUNTER — Encounter: Payer: Self-pay | Admitting: Family Medicine

## 2017-12-02 VITALS — BP 110/80 | HR 63 | Temp 98.0°F | Ht 71.0 in | Wt 195.0 lb

## 2017-12-02 DIAGNOSIS — L989 Disorder of the skin and subcutaneous tissue, unspecified: Secondary | ICD-10-CM | POA: Diagnosis not present

## 2017-12-02 MED ORDER — TADALAFIL 20 MG PO TABS: 20.0000 mg | ORAL_TABLET | Freq: Every day | ORAL | 11 refills | Status: DC | PRN

## 2017-12-02 NOTE — Progress Notes (Signed)
   Subjective:    Patient ID: Anthony Ayers, male    DOB: 1971/03/29, 47 y.o.   MRN: [id number]  HPI Here to check a lesion on the back that he discovered yesterday. It is not symptomatic.   Review of Systems  Constitutional: Negative.   Respiratory: Negative.   Cardiovascular: Negative.        Objective:   Physical Exam  Constitutional: He appears well-developed and well-nourished.  Cardiovascular: Normal rate, regular rhythm, normal heart sounds and intact distal pulses.  Pulmonary/Chest: Effort normal and breath sounds normal.  Skin:  The lesion in question is a seborrheic keratosis, and he has several others like this on the back           Assessment & Plan:  He is reassured of the benign nature of these lesions. No treatment is needed.  Alysia Penna, MD

## 2018-01-25 ENCOUNTER — Encounter: Payer: Self-pay | Admitting: Family Medicine

## 2018-01-25 ENCOUNTER — Ambulatory Visit (INDEPENDENT_AMBULATORY_CARE_PROVIDER_SITE_OTHER): Payer: PRIVATE HEALTH INSURANCE | Admitting: Family Medicine

## 2018-01-25 VITALS — BP 120/76 | HR 67 | Temp 98.1°F | Ht 71.0 in | Wt 193.8 lb

## 2018-01-25 DIAGNOSIS — Z23 Encounter for immunization: Secondary | ICD-10-CM

## 2018-01-25 DIAGNOSIS — Z0000 Encounter for general adult medical examination without abnormal findings: Secondary | ICD-10-CM

## 2018-01-25 LAB — POC URINALSYSI DIPSTICK (AUTOMATED)
Bilirubin, UA: NEGATIVE
GLUCOSE UA: NEGATIVE
Ketones, UA: NEGATIVE
Leukocytes, UA: NEGATIVE
NITRITE UA: NEGATIVE
PH UA: 6 (ref 5.0–8.0)
Protein, UA: NEGATIVE
RBC UA: NEGATIVE
Spec Grav, UA: 1.025 (ref 1.010–1.025)
UROBILINOGEN UA: 0.2 U/dL

## 2018-01-25 LAB — CBC WITH DIFFERENTIAL/PLATELET
BASOS ABS: 0.1 10*3/uL (ref 0.0–0.1)
Basophils Relative: 1.9 % (ref 0.0–3.0)
EOS ABS: 0.5 10*3/uL (ref 0.0–0.7)
Eosinophils Relative: 9.8 % — ABNORMAL HIGH (ref 0.0–5.0)
HCT: 44.4 % (ref 39.0–52.0)
Hemoglobin: 15.3 g/dL (ref 13.0–17.0)
LYMPHS ABS: 1.9 10*3/uL (ref 0.7–4.0)
Lymphocytes Relative: 33.9 % (ref 12.0–46.0)
MCHC: 34.5 g/dL (ref 30.0–36.0)
MCV: 88.8 fl (ref 78.0–100.0)
MONOS PCT: 8.1 % (ref 3.0–12.0)
Monocytes Absolute: 0.4 10*3/uL (ref 0.1–1.0)
NEUTROS PCT: 46.3 % (ref 43.0–77.0)
Neutro Abs: 2.6 10*3/uL (ref 1.4–7.7)
Platelets: 302 10*3/uL (ref 150.0–400.0)
RBC: 5 Mil/uL (ref 4.22–5.81)
RDW: 13 % (ref 11.5–15.5)
WBC: 5.6 10*3/uL (ref 4.0–10.5)

## 2018-01-25 LAB — HEPATIC FUNCTION PANEL
ALK PHOS: 51 U/L (ref 39–117)
ALT: 18 U/L (ref 0–53)
AST: 18 U/L (ref 0–37)
Albumin: 4.9 g/dL (ref 3.5–5.2)
Bilirubin, Direct: 0.1 mg/dL (ref 0.0–0.3)
Total Bilirubin: 0.6 mg/dL (ref 0.2–1.2)
Total Protein: 7.8 g/dL (ref 6.0–8.3)

## 2018-01-25 LAB — LIPID PANEL
Cholesterol: 230 mg/dL — ABNORMAL HIGH (ref 0–200)
HDL: 48.5 mg/dL (ref >39.00)
LDL CALC: 151 mg/dL — AB (ref 0–99)
NONHDL: 181.03
Total CHOL/HDL Ratio: 5
Triglycerides: 152 mg/dL — ABNORMAL HIGH (ref 0.0–149.0)
VLDL: 30.4 mg/dL (ref 0.0–40.0)

## 2018-01-25 LAB — BASIC METABOLIC PANEL
BUN: 12 mg/dL (ref 6–23)
CO2: 29 mEq/L (ref 19–32)
Calcium: 9.9 mg/dL (ref 8.4–10.5)
Chloride: 101 mEq/L (ref 96–112)
Creatinine, Ser: 0.82 mg/dL (ref 0.40–1.50)
GFR: 105.86 mL/min (ref >60.00)
Glucose, Bld: 87 mg/dL (ref 70–99)
POTASSIUM: 4.1 meq/L (ref 3.5–5.1)
SODIUM: 139 meq/L (ref 135–145)

## 2018-01-25 LAB — TSH: TSH: 1.87 u[IU]/mL (ref 0.35–4.50)

## 2018-01-25 LAB — TESTOSTERONE: TESTOSTERONE: 237.07 ng/dL — AB (ref 300.00–890.00)

## 2018-01-25 NOTE — Progress Notes (Signed)
   Subjective:    Patient ID: Anthony Ayers, male    DOB: 1971-04-19, 47 y.o.   MRN: [id number]  HPI Here for a well exam. He feels well.   Review of Systems  Constitutional: Negative.   HENT: Negative.   Eyes: Negative.   Respiratory: Negative.   Cardiovascular: Negative.   Gastrointestinal: Negative.   Genitourinary: Negative.   Musculoskeletal: Negative.   Skin: Negative.   Neurological: Negative.   Psychiatric/Behavioral: Negative.        Objective:   Physical Exam  Constitutional: He is oriented to person, place, and time. He appears well-developed and well-nourished. No distress.  HENT:  Head: Normocephalic and atraumatic.  Right Ear: External ear normal.  Left Ear: External ear normal.  Nose: Nose normal.  Mouth/Throat: Oropharynx is clear and moist. No oropharyngeal exudate.  Eyes: Pupils are equal, round, and reactive to light. Conjunctivae and EOM are normal. Right eye exhibits no discharge. Left eye exhibits no discharge. No scleral icterus.  Neck: Neck supple. No JVD present. No tracheal deviation present. No thyromegaly present.  Cardiovascular: Normal rate, regular rhythm, normal heart sounds and intact distal pulses. Exam reveals no gallop and no friction rub.  No murmur heard. Pulmonary/Chest: Effort normal and breath sounds normal. No respiratory distress. He has no wheezes. He has no rales. He exhibits no tenderness.  Abdominal: Soft. Bowel sounds are normal. He exhibits no distension and no mass. There is no tenderness. There is no rebound and no guarding.  Genitourinary: Penis normal. No penile tenderness.  Musculoskeletal: Normal range of motion. He exhibits no edema or tenderness.  Lymphadenopathy:    He has no cervical adenopathy.  Neurological: He is alert and oriented to person, place, and time. He has normal reflexes. He displays normal reflexes. No cranial nerve deficit. He exhibits normal muscle tone. Coordination normal.  Skin: Skin is warm and  dry. No rash noted. He is not diaphoretic. No erythema. No pallor.  Psychiatric: He has a normal mood and affect. His behavior is normal. Judgment and thought content normal.          Assessment & Plan:  Well exam. We discussed diet and exercise. Get fasting labs.  Alysia Penna, MD

## 2018-01-31 ENCOUNTER — Encounter: Payer: Self-pay | Admitting: *Deleted

## 2018-02-02 MED ORDER — ATORVASTATIN CALCIUM 20 MG PO TABS: 20.0000 mg | ORAL_TABLET | Freq: Every day | ORAL | 3 refills | Status: DC

## 2018-02-02 NOTE — Telephone Encounter (Signed)
Dr. Sarajane Jews please advise if ok to change from the cialis to the generic viagra per pts request. Thanks

## 2018-02-04 NOTE — Telephone Encounter (Signed)
Yes schedule a lipid panel and liver panel for 90 days out. It is okay to switch to generic Viagra

## 2018-02-07 MED ORDER — ATORVASTATIN CALCIUM 20 MG PO TABS: 20.0000 mg | ORAL_TABLET | Freq: Every day | ORAL | 3 refills | Status: DC

## 2018-02-09 MED ORDER — SILDENAFIL CITRATE 20 MG PO TABS: 20.0000 mg | ORAL_TABLET | Freq: Three times a day (TID) | ORAL | 5 refills | Status: DC

## 2018-02-09 NOTE — Addendum Note (Signed)
Addended by: Elie Confer on: 02/09/2018 08:49 AM   Modules accepted: Orders

## 2018-02-25 ENCOUNTER — Encounter: Payer: Self-pay | Admitting: Family Medicine

## 2018-02-25 NOTE — Telephone Encounter (Signed)
Dr. Fry please advise. Thanks  

## 2018-02-25 NOTE — Telephone Encounter (Signed)
First have him contact his insurance company to see what treatments they will cover. Based on that, we can decide on one

## 2018-05-06 ENCOUNTER — Other Ambulatory Visit (INDEPENDENT_AMBULATORY_CARE_PROVIDER_SITE_OTHER): Payer: PRIVATE HEALTH INSURANCE

## 2018-05-06 ENCOUNTER — Other Ambulatory Visit: Payer: Self-pay | Admitting: Family Medicine

## 2018-05-06 DIAGNOSIS — E7800 Pure hypercholesterolemia, unspecified: Secondary | ICD-10-CM

## 2018-05-06 LAB — LIPID PANEL
CHOL/HDL RATIO: 3
Cholesterol: 149 mg/dL (ref 0–200)
HDL: 45.7 mg/dL (ref >39.00)
LDL Cholesterol: 90 mg/dL (ref 0–99)
NONHDL: 102.96
Triglycerides: 63 mg/dL (ref 0.0–149.0)
VLDL: 12.6 mg/dL (ref 0.0–40.0)

## 2018-05-09 ENCOUNTER — Encounter: Payer: Self-pay | Admitting: *Deleted

## 2018-05-09 NOTE — Telephone Encounter (Signed)
Dr. Sarajane Jews please advise on the concerns of the patient.  Thanks

## 2018-05-12 NOTE — Telephone Encounter (Signed)
As for the Lipitor, he can stop it and we can follow up the lipids 90 days later. As for testosterone replacement, he should contact is insurance company to see what they will cover

## 2018-05-27 ENCOUNTER — Encounter: Payer: Self-pay | Admitting: Family Medicine

## 2018-06-30 ENCOUNTER — Encounter: Payer: Self-pay | Admitting: Family Medicine

## 2018-06-30 NOTE — Telephone Encounter (Signed)
Dr. Fry please advise. Thanks  

## 2018-07-01 ENCOUNTER — Telehealth: Payer: Self-pay | Admitting: Family Medicine

## 2018-07-01 MED ORDER — SILDENAFIL CITRATE 100 MG PO TABS: 100.0000 mg | ORAL_TABLET | Freq: Every day | ORAL | 11 refills | Status: DC | PRN

## 2018-07-01 NOTE — Telephone Encounter (Signed)
This has already been done.  Pt was sent a my chart message

## 2018-07-01 NOTE — Addendum Note (Signed)
Addended by: Elie Confer on: 07/01/2018 02:03 PM   Modules accepted: Orders

## 2018-07-01 NOTE — Telephone Encounter (Signed)
Copied from [place] [id number]. Topic: Quick Communication - See Telephone Encounter >> Jul 01, 2018  1:46 PM Conception Chancy, NT wrote: CRM for notification. See Telephone encounter for: 07/01/18.  Patient is calling and requesting a transfer for sildenafil (VIAGRA) 100 MG tablet to the following pharmacy.  [facility] [address] [place] [address] [place] [address] Phone: [phone] Fax: [phone]

## 2018-07-01 NOTE — Telephone Encounter (Signed)
Call in Sildenafil 100 mg to take prn, #10 with 11 rf

## 2018-08-01 ENCOUNTER — Encounter: Payer: Self-pay | Admitting: Family Medicine

## 2018-08-01 NOTE — Telephone Encounter (Signed)
Dr. Fry please advise. Thanks  

## 2018-08-02 NOTE — Telephone Encounter (Signed)
I do not prescribe compounded creams like that. I can refer him to someone like a Urologist if he wishes

## 2018-08-08 ENCOUNTER — Encounter: Payer: Self-pay | Admitting: Family Medicine

## 2018-08-09 ENCOUNTER — Ambulatory Visit: Payer: Self-pay

## 2018-08-09 NOTE — Telephone Encounter (Signed)
No the test is only available at the hospitals. I would self quarantine for 14 days and if he gets any symptoms, we can have a Webex visit

## 2018-08-09 NOTE — Telephone Encounter (Signed)
  Reason for Disposition . [1] Caller requesting NON-URGENT health information AND [2] PCP's office is the best resource  Answer Assessment - Initial Assessment Questions 1. REASON FOR CALL or QUESTION: "What is your reason for calling today?" or "How can I best help you?" or "What question do you have that I can help answer?"     Pt's secretary's husband came in direct contact with +covid-19 pt. Pt asking if he should be screened. Pt informed to monitor hs symptoms and instructed to call telehealth if having any difficulty breathing, or Shortness of breath. If secretary is having symptoms then pt advised to put himself and anyone else in the office to put on quarantine. Pt is asymptomatic.  Protocols used: INFORMATION ONLY CALL-A-AH

## 2018-08-09 NOTE — Telephone Encounter (Signed)
Dr. Fry please advise. Thanks  

## 2018-08-09 NOTE — Telephone Encounter (Signed)
Noted  

## 2019-01-05 ENCOUNTER — Encounter: Payer: Self-pay | Admitting: Family Medicine

## 2019-01-06 NOTE — Telephone Encounter (Signed)
Set up a Doxy visit so we can talk about this  

## 2019-01-06 NOTE — Telephone Encounter (Signed)
Copied from [place] [id number]. Topic: General - Other >> Jan 06, 2019  3:07 PM Leward Quan A wrote: Reason for CRM: Patient called back for a virtual appointment. Patient can be reached at Ph# [phone]   LMVM for the patient to contact the office to make an appointment for a  virtual visit.  I'm going to try and contact the patient through MyChart to see if he wants to make a virtual appointment for Saturday clinic.

## 2019-01-09 ENCOUNTER — Telehealth (INDEPENDENT_AMBULATORY_CARE_PROVIDER_SITE_OTHER): Payer: PRIVATE HEALTH INSURANCE | Admitting: Family Medicine

## 2019-01-09 ENCOUNTER — Encounter: Payer: Self-pay | Admitting: Family Medicine

## 2019-01-09 ENCOUNTER — Other Ambulatory Visit: Payer: Self-pay

## 2019-01-09 DIAGNOSIS — U071 COVID-19: Secondary | ICD-10-CM

## 2019-01-09 NOTE — Progress Notes (Signed)
Virtual Visit via Video Note  I connected with the patient on 01/09/19 at  3:45 PM EDT by a video enabled telemedicine application and verified that I am speaking with the correct person using two identifiers.  Location patient: home Location provider:work or home office Persons participating in the virtual visit: patient, provider  I discussed the limitations of evaluation and management by telemedicine and the availability of in person appointments. The patient expressed understanding and agreed to proceed.   HPI: Here to follow up on a Covid-19 infection. On 01-03-19 he began to feel a mild fatigue, he had a fever to 99 degrees, a ST, and a dry cough. No headache or body aches or SOB or chest pain or NVD. On 01-04-19 he was tested for the Covid virus, and he was positive. He has been quarantining himself at home since then. His step-daughter has also tested positive, and they live in a household of 5 people. The other 3 have not been tested and they do not have symptoms. Rather than getting tested, these 3 people have decided to quarantine the entire family for 14 days. Today Alvia feels much better and is almost back to normal.    ROS: See pertinent positives and negatives per HPI.  No past medical history on file.  Past Surgical History:  Procedure Laterality Date  . ESOPHAGEAL DILATION  2008   times 2   . VASECTOMY      No family history on file.   Current Outpatient Medications:  .  atorvastatin (LIPITOR) 20 MG tablet, Take 1 tablet (20 mg total) by mouth daily., Disp: 90 tablet, Rfl: 3 .  sildenafil (REVATIO) 20 MG tablet, Take 1 tablet (20 mg total) by mouth 3 (three) times daily., Disp: 10 tablet, Rfl: 5 .  sildenafil (VIAGRA) 100 MG tablet, Take 1 tablet (100 mg total) by mouth daily as needed for erectile dysfunction., Disp: 10 tablet, Rfl: 11  EXAM:  VITALS per patient if applicable:  GENERAL: alert, oriented, appears well and in no acute distress  HEENT:  atraumatic, conjunttiva clear, no obvious abnormalities on inspection of external nose and ears  NECK: normal movements of the head and neck  LUNGS: on inspection no signs of respiratory distress, breathing rate appears normal, no obvious gross SOB, gasping or wheezing  CV: no obvious cyanosis  MS: moves all visible extremities without noticeable abnormality  PSYCH/NEURO: pleasant and cooperative, no obvious depression or anxiety, speech and thought processing grossly intact  ASSESSMENT AND PLAN: Positive for Covid infection. He will quarantine at home until 01-17-19 (a total of 14 days). Recheck prn.  Alysia Penna, MD  Discussed the following assessment and plan:  No diagnosis found.     I discussed the assessment and treatment plan with the patient. The patient was provided an opportunity to ask questions and all were answered. The patient agreed with the plan and demonstrated an understanding of the instructions.   The patient was advised to call back or seek an in-person evaluation if the symptoms worsen or if the condition fails to improve as anticipated.

## 2019-01-30 ENCOUNTER — Other Ambulatory Visit: Payer: Self-pay

## 2019-01-30 ENCOUNTER — Encounter: Payer: Self-pay | Admitting: Family Medicine

## 2019-01-30 ENCOUNTER — Ambulatory Visit (INDEPENDENT_AMBULATORY_CARE_PROVIDER_SITE_OTHER): Payer: No Typology Code available for payment source | Admitting: Family Medicine

## 2019-01-30 VITALS — BP 110/78 | Temp 97.9°F | Ht 70.75 in | Wt 190.8 lb

## 2019-01-30 DIAGNOSIS — Z125 Encounter for screening for malignant neoplasm of prostate: Secondary | ICD-10-CM

## 2019-01-30 DIAGNOSIS — E291 Testicular hypofunction: Secondary | ICD-10-CM | POA: Diagnosis not present

## 2019-01-30 DIAGNOSIS — Z0000 Encounter for general adult medical examination without abnormal findings: Secondary | ICD-10-CM | POA: Diagnosis not present

## 2019-01-30 DIAGNOSIS — Z23 Encounter for immunization: Secondary | ICD-10-CM

## 2019-01-30 LAB — HEPATIC FUNCTION PANEL
ALT: 17 U/L (ref 0–53)
AST: 21 U/L (ref 0–37)
Albumin: 4.8 g/dL (ref 3.5–5.2)
Alkaline Phosphatase: 56 U/L (ref 39–117)
Bilirubin, Direct: 0.1 mg/dL (ref 0.0–0.3)
Total Bilirubin: 0.7 mg/dL (ref 0.2–1.2)
Total Protein: 7.6 g/dL (ref 6.0–8.3)

## 2019-01-30 LAB — BASIC METABOLIC PANEL
BUN: 11 mg/dL (ref 6–23)
CO2: 28 mEq/L (ref 19–32)
Calcium: 9.8 mg/dL (ref 8.4–10.5)
Chloride: 99 mEq/L (ref 96–112)
Creatinine, Ser: 0.8 mg/dL (ref 0.40–1.50)
GFR: 102.06 mL/min (ref >60.00)
Glucose, Bld: 83 mg/dL (ref 70–99)
Potassium: 4.1 mEq/L (ref 3.5–5.1)
Sodium: 138 mEq/L (ref 135–145)

## 2019-01-30 LAB — CBC WITH DIFFERENTIAL/PLATELET
Basophils Absolute: 0.1 10*3/uL (ref 0.0–0.1)
Basophils Relative: 0.9 % (ref 0.0–3.0)
Eosinophils Absolute: 0.3 10*3/uL (ref 0.0–0.7)
Eosinophils Relative: 4.9 % (ref 0.0–5.0)
HCT: 41.9 % (ref 39.0–52.0)
Hemoglobin: 14.4 g/dL (ref 13.0–17.0)
Lymphocytes Relative: 32.5 % (ref 12.0–46.0)
Lymphs Abs: 1.8 10*3/uL (ref 0.7–4.0)
MCHC: 34.4 g/dL (ref 30.0–36.0)
MCV: 89.7 fl (ref 78.0–100.0)
Monocytes Absolute: 0.5 10*3/uL (ref 0.1–1.0)
Monocytes Relative: 9.2 % (ref 3.0–12.0)
Neutro Abs: 2.9 10*3/uL (ref 1.4–7.7)
Neutrophils Relative %: 52.5 % (ref 43.0–77.0)
Platelets: 305 10*3/uL (ref 150.0–400.0)
RBC: 4.67 Mil/uL (ref 4.22–5.81)
RDW: 13.1 % (ref 11.5–15.5)
WBC: 5.6 10*3/uL (ref 4.0–10.5)

## 2019-01-30 LAB — LIPID PANEL
Cholesterol: 215 mg/dL — ABNORMAL HIGH (ref 0–200)
HDL: 49.5 mg/dL (ref >39.00)
LDL Cholesterol: 134 mg/dL — ABNORMAL HIGH (ref 0–99)
NonHDL: 165.05
Total CHOL/HDL Ratio: 4
Triglycerides: 155 mg/dL — ABNORMAL HIGH (ref 0.0–149.0)
VLDL: 31 mg/dL (ref 0.0–40.0)

## 2019-01-30 LAB — TESTOSTERONE: Testosterone: 265.88 ng/dL — ABNORMAL LOW (ref 300.00–890.00)

## 2019-01-30 LAB — POC URINALSYSI DIPSTICK (AUTOMATED)
Bilirubin, UA: NEGATIVE
Blood, UA: NEGATIVE
Glucose, UA: NEGATIVE
Ketones, UA: NEGATIVE
Leukocytes, UA: NEGATIVE
Nitrite, UA: NEGATIVE
Protein, UA: NEGATIVE
Spec Grav, UA: 1.01 (ref 1.010–1.025)
Urobilinogen, UA: 0.2 E.U./dL
pH, UA: 6.5 (ref 5.0–8.0)

## 2019-01-30 LAB — TSH: TSH: 1.8 u[IU]/mL (ref 0.35–4.50)

## 2019-01-30 LAB — PSA: PSA: 3.33 ng/mL (ref 0.10–4.00)

## 2019-01-30 NOTE — Progress Notes (Signed)
   Subjective:    Patient ID: Anthony Ayers, male    DOB: 02/09/1971, 48 y.o.   MRN: [id number]  HPI Here for a well exam. He feels fine. He tested positive for the Covid-19 virus last month, and all his symptoms have resolved.    Review of Systems  Constitutional: Negative.   HENT: Negative.   Eyes: Negative.   Respiratory: Negative.   Cardiovascular: Negative.   Gastrointestinal: Negative.   Genitourinary: Negative.   Musculoskeletal: Negative.   Skin: Negative.   Neurological: Negative.   Psychiatric/Behavioral: Negative.        Objective:   Physical Exam Constitutional:      General: He is not in acute distress.    Appearance: He is well-developed. He is not diaphoretic.  HENT:     Head: Normocephalic and atraumatic.     Right Ear: External ear normal.     Left Ear: External ear normal.     Nose: Nose normal.     Mouth/Throat:     Pharynx: No oropharyngeal exudate.  Eyes:     General: No scleral icterus.       Right eye: No discharge.        Left eye: No discharge.     Conjunctiva/sclera: Conjunctivae normal.     Pupils: Pupils are equal, round, and reactive to light.  Neck:     Musculoskeletal: Neck supple.     Thyroid: No thyromegaly.     Vascular: No JVD.     Trachea: No tracheal deviation.  Cardiovascular:     Rate and Rhythm: Normal rate and regular rhythm.     Heart sounds: Normal heart sounds. No murmur. No friction rub. No gallop.   Pulmonary:     Effort: Pulmonary effort is normal. No respiratory distress.     Breath sounds: Normal breath sounds. No wheezing or rales.  Chest:     Chest wall: No tenderness.  Abdominal:     General: Bowel sounds are normal. There is no distension.     Palpations: Abdomen is soft. There is no mass.     Tenderness: There is no abdominal tenderness. There is no guarding or rebound.  Genitourinary:    Penis: Normal. No tenderness.      Scrotum/Testes: Normal.     Prostate: Normal.     Rectum: Normal. Guaiac result  negative.  Musculoskeletal: Normal range of motion.        General: No tenderness.  Lymphadenopathy:     Cervical: No cervical adenopathy.  Skin:    General: Skin is warm and dry.     Coloration: Skin is not pale.     Findings: No erythema or rash.  Neurological:     Mental Status: He is alert and oriented to person, place, and time.     Cranial Nerves: No cranial nerve deficit.     Motor: No abnormal muscle tone.     Coordination: Coordination normal.     Deep Tendon Reflexes: Reflexes are normal and symmetric. Reflexes normal.  Psychiatric:        Behavior: Behavior normal.        Thought Content: Thought content normal.        Judgment: Judgment normal.           Assessment & Plan:  Well exam. We discussed diet and exercise. Get fasting labs, including Covid antibodies.  Alysia Penna, MD

## 2019-01-30 NOTE — Addendum Note (Signed)
Addended by: Gwenyth Ober R on: 01/30/2019 04:08 PM   Modules accepted: Orders

## 2019-01-30 NOTE — Patient Instructions (Signed)
Health Maintenance Due  Topic Date Due  . HIV Screening  06/08/1983  . TETANUS/TDAP  06/08/1987  . COLONOSCOPY  06/07/2018  . INFLUENZA VACCINE  12/17/2018    No flowsheet data found.

## 2019-02-01 ENCOUNTER — Other Ambulatory Visit: Payer: Self-pay

## 2019-02-06 ENCOUNTER — Other Ambulatory Visit: Payer: Self-pay

## 2019-02-06 ENCOUNTER — Telehealth: Payer: Self-pay | Admitting: Family Medicine

## 2019-02-06 ENCOUNTER — Telehealth: Payer: Self-pay

## 2019-02-06 MED ORDER — TESTOSTERONE 20.25 MG/ACT (1.62%) TD GEL: 4.0000 | Freq: Every day | TRANSDERMAL | 5 refills | Status: DC

## 2019-02-06 MED ORDER — ATORVASTATIN CALCIUM 20 MG PO TABS: 20.0000 mg | ORAL_TABLET | Freq: Every day | ORAL | 3 refills | Status: DC

## 2019-02-06 NOTE — Telephone Encounter (Signed)
-----   Message from Franco Collet, [place] sent at 02/03/2019  5:32 PM EDT ----- Please advise

## 2019-02-06 NOTE — Addendum Note (Signed)
Addended by: Alysia Penna A on: 02/06/2019 12:42 PM   Modules accepted: Orders

## 2019-02-06 NOTE — Addendum Note (Signed)
Addended by: Gwenyth Ober R on: 02/06/2019 04:51 PM   Modules accepted: Orders

## 2019-02-06 NOTE — Telephone Encounter (Signed)
Rx sent in electronically. Testosterone place in filing cabinet in front office for pt to pick up.

## 2019-02-10 ENCOUNTER — Encounter: Payer: Self-pay | Admitting: Family Medicine

## 2019-05-31 ENCOUNTER — Encounter: Payer: Self-pay | Admitting: Family Medicine

## 2019-05-31 MED ORDER — TESTOSTERONE 20.25 MG/ACT (1.62%) TD GEL: 4.0000 | Freq: Every day | TRANSDERMAL | 5 refills | Status: DC

## 2019-05-31 MED ORDER — SILDENAFIL CITRATE 20 MG PO TABS: 20.0000 mg | ORAL_TABLET | Freq: Three times a day (TID) | ORAL | 11 refills | Status: DC

## 2019-05-31 NOTE — Telephone Encounter (Signed)
Sildenafil last filled 2019 Testosterone last filled 02/06/2019 Last OV 01/30/2019  Ok to fill?

## 2019-05-31 NOTE — Telephone Encounter (Signed)
Both of these were sent in

## 2019-06-13 ENCOUNTER — Encounter: Payer: Self-pay | Admitting: Family Medicine

## 2019-06-14 MED ORDER — SILDENAFIL CITRATE 100 MG PO TABS: 100.0000 mg | ORAL_TABLET | Freq: Every day | ORAL | 0 refills | Status: DC | PRN

## 2019-06-14 NOTE — Telephone Encounter (Signed)
Yes please change the rx

## 2019-06-14 NOTE — Addendum Note (Signed)
Addended by: Rebecca Eaton on: 06/14/2019 09:06 AM   Modules accepted: Orders

## 2019-06-26 ENCOUNTER — Encounter: Payer: Self-pay | Admitting: Family Medicine

## 2019-06-26 DIAGNOSIS — Z0000 Encounter for general adult medical examination without abnormal findings: Secondary | ICD-10-CM

## 2019-06-26 NOTE — Telephone Encounter (Signed)
Please advise. I do not see a colonoscopy order.  Will call the patient to schedule.

## 2019-06-29 ENCOUNTER — Encounter: Payer: Self-pay | Admitting: Gastroenterology

## 2019-06-29 NOTE — Telephone Encounter (Signed)
I did the referral for colonoscopy. Please set up his CPX

## 2019-07-06 ENCOUNTER — Other Ambulatory Visit: Payer: Self-pay | Admitting: Family Medicine

## 2019-07-28 ENCOUNTER — Other Ambulatory Visit: Payer: Self-pay

## 2019-07-28 ENCOUNTER — Ambulatory Visit (AMBULATORY_SURGERY_CENTER): Payer: Self-pay | Admitting: *Deleted

## 2019-07-28 VITALS — Temp 97.3°F | Ht 70.75 in | Wt 195.6 lb

## 2019-07-28 DIAGNOSIS — Z1159 Encounter for screening for other viral diseases: Secondary | ICD-10-CM

## 2019-07-28 DIAGNOSIS — Z1211 Encounter for screening for malignant neoplasm of colon: Secondary | ICD-10-CM

## 2019-07-28 MED ORDER — SUPREP BOWEL PREP KIT 17.5-3.13-1.6 GM/177ML PO SOLN: 1.0000 | Freq: Once | ORAL | 0 refills | Status: AC

## 2019-07-28 NOTE — Progress Notes (Signed)
Patient denies any allergies to egg or soy products. Patient denies complications with anesthesia/sedation.  Patient denies oxygen use at home and denies diet medications. Emmi instructions for colonoscopy/endoscopy explained and given to patient.  Suprep coupon given.   

## 2019-07-31 ENCOUNTER — Other Ambulatory Visit: Payer: Self-pay | Admitting: Family Medicine

## 2019-08-08 ENCOUNTER — Ambulatory Visit (INDEPENDENT_AMBULATORY_CARE_PROVIDER_SITE_OTHER): Payer: No Typology Code available for payment source

## 2019-08-08 ENCOUNTER — Other Ambulatory Visit: Payer: Self-pay

## 2019-08-08 ENCOUNTER — Other Ambulatory Visit: Payer: Self-pay | Admitting: Gastroenterology

## 2019-08-08 DIAGNOSIS — Z1159 Encounter for screening for other viral diseases: Secondary | ICD-10-CM

## 2019-08-08 LAB — SARS CORONAVIRUS 2 (TAT 6-24 HRS): SARS Coronavirus 2: NEGATIVE

## 2019-08-11 ENCOUNTER — Other Ambulatory Visit: Payer: Self-pay

## 2019-08-11 ENCOUNTER — Ambulatory Visit (AMBULATORY_SURGERY_CENTER): Payer: No Typology Code available for payment source | Admitting: Gastroenterology

## 2019-08-11 ENCOUNTER — Encounter: Payer: Self-pay | Admitting: Gastroenterology

## 2019-08-11 VITALS — BP 98/71 | HR 51 | Temp 96.0°F | Resp 9 | Ht 70.75 in | Wt 195.0 lb

## 2019-08-11 DIAGNOSIS — D125 Benign neoplasm of sigmoid colon: Secondary | ICD-10-CM

## 2019-08-11 DIAGNOSIS — D123 Benign neoplasm of transverse colon: Secondary | ICD-10-CM

## 2019-08-11 DIAGNOSIS — D124 Benign neoplasm of descending colon: Secondary | ICD-10-CM

## 2019-08-11 DIAGNOSIS — Z1211 Encounter for screening for malignant neoplasm of colon: Secondary | ICD-10-CM

## 2019-08-11 HISTORY — PX: COLONOSCOPY: SHX174

## 2019-08-11 MED ORDER — SODIUM CHLORIDE 0.9 % IV SOLN: 500.0000 mL | Freq: Once | INTRAVENOUS | Status: DC

## 2019-08-11 NOTE — Patient Instructions (Signed)
Handouts provided on polyps, diverticulosis, and hemorrhoids.   YOU HAD AN ENDOSCOPIC PROCEDURE TODAY AT [facility]:   Refer to the procedure report that was given to you for any specific questions about what was found during the examination.  If the procedure report does not answer your questions, please call your gastroenterologist to clarify.  If you requested that your care partner not be given the details of your procedure findings, then the procedure report has been included in a sealed envelope for you to review at your convenience later.  YOU SHOULD EXPECT: Some feelings of bloating in the abdomen. Passage of more gas than usual.  Walking can help get rid of the air that was put into your GI tract during the procedure and reduce the bloating. If you had a lower endoscopy (such as a colonoscopy or flexible sigmoidoscopy) you may notice spotting of blood in your stool or on the toilet paper. If you underwent a bowel prep for your procedure, you may not have a normal bowel movement for a few days.  Please Note:  You might notice some irritation and congestion in your nose or some drainage.  This is from the oxygen used during your procedure.  There is no need for concern and it should clear up in a day or so.  SYMPTOMS TO REPORT IMMEDIATELY:   Following lower endoscopy (colonoscopy or flexible sigmoidoscopy):  Excessive amounts of blood in the stool  Significant tenderness or worsening of abdominal pains  Swelling of the abdomen that is new, acute  Fever of 100F or higher   For urgent or emergent issues, a gastroenterologist can be reached at any hour by calling [phone]. Do not use MyChart messaging for urgent concerns.    DIET:  We do recommend a small meal at first, but then you may proceed to your regular diet.  Drink plenty of fluids but you should avoid alcoholic beverages for 24 hours.  ACTIVITY:  You should plan to take it easy for the rest of today and  you should NOT DRIVE or use heavy machinery until tomorrow (because of the sedation medicines used during the test).    FOLLOW UP: Our staff will call the number listed on your records 48-72 hours following your procedure to check on you and address any questions or concerns that you may have regarding the information given to you following your procedure. If we do not reach you, we will leave a message.  We will attempt to reach you two times.  During this call, we will ask if you have developed any symptoms of COVID 19. If you develop any symptoms (ie: fever, flu-like symptoms, shortness of breath, cough etc.) before then, please call [phone].  If you test positive for Covid 19 in the 2 weeks post procedure, please call and report this information to [place].    If any biopsies were taken you will be contacted by phone or by letter within the next 1-3 weeks.  Please call us at [phone] if you have not heard about the biopsies in 3 weeks.    SIGNATURES/CONFIDENTIALITY: You and/or your care partner have signed paperwork which will be entered into your electronic medical record.  These signatures attest to the fact that that the information above on your After Visit Summary has been reviewed and is understood.  Full responsibility of the confidentiality of this discharge information lies with you and/or your care-partner.

## 2019-08-11 NOTE — Op Note (Signed)
[facility] Patient Name: Anthony Ayers Procedure Date: 08/11/2019 11:02 AM MRN: [id number] Endoscopist: Remo Lipps P. Havery Moros , MD Age: 49 Referring MD:  Date of Birth: [birth year] Gender: Male Account #: [id number] Procedure:                Colonoscopy Indications:              Screening for colorectal malignant neoplasm, This                            is the patient's first colonoscopy Medicines:                Monitored Anesthesia Care Procedure:                Pre-Anesthesia Assessment:                           - Prior to the procedure, a History and Physical                            was performed, and patient medications and                            allergies were reviewed. The patient's tolerance of                            previous anesthesia was also reviewed. The risks                            and benefits of the procedure and the sedation                            options and risks were discussed with the patient.                            All questions were answered, and informed consent                            was obtained. Prior Anticoagulants: The patient has                            taken no previous anticoagulant or antiplatelet                            agents. ASA Grade Assessment: II - A patient with                            mild systemic disease. After reviewing the risks                            and benefits, the patient was deemed in                            satisfactory condition to undergo the procedure.  After obtaining informed consent, the colonoscope                            was passed under direct vision. Throughout the                            procedure, the patient's blood pressure, pulse, and                            oxygen saturations were monitored continuously. The                            Colonoscope was introduced through the anus and                            advanced to the the  cecum, identified by                            appendiceal orifice and ileocecal valve. The                            colonoscopy was performed without difficulty. The                            patient tolerated the procedure well. The quality                            of the bowel preparation was good. The ileocecal                            valve, appendiceal orifice, and rectum were                            photographed. Scope In: 11:10:39 AM Scope Out: 11:34:48 AM Scope Withdrawal Time: 0 hours 14 minutes 0 seconds  Total Procedure Duration: 0 hours 24 minutes 9 seconds  Findings:                 The perianal and digital rectal examinations were                            normal.                           Two sessile polyps were found in the transverse                            colon. The polyps were 3 to 6 mm in size. These                            polyps were removed with a cold snare. Resection                            and retrieval were complete.  A diminutive polyp was found in the splenic                            flexure. The polyp was sessile. The polyp was                            removed with a cold snare. Resection and retrieval                            were complete.                           A 5 mm polyp was found in the sigmoid colon. The                            polyp was sessile. The polyp was removed with a                            cold snare. Resection and retrieval were complete.                           Scattered small-mouthed diverticula were found in                            the sigmoid colon and ascending colon.                           The sigmoid colon was extremely tortuous.                           Internal hemorrhoids were found during                            retroflexion. The hemorrhoids were small.                           The exam was otherwise without abnormality. Complications:            No  immediate complications. Estimated blood loss:                            Minimal. Estimated Blood Loss:     Estimated blood loss was minimal. Impression:               - Two 3 to 6 mm polyps in the transverse colon,                            removed with a cold snare. Resected and retrieved.                           - One diminutive polyp at the splenic flexure,                            removed with a cold snare. Resected and retrieved.                           -  One 5 mm polyp in the sigmoid colon, removed with                            a cold snare. Resected and retrieved.                           - Diverticulosis in the sigmoid colon and in the                            ascending colon.                           - Tortuous colon.                           - Internal hemorrhoids.                           - The examination was otherwise normal. Recommendation:           - Patient has a contact number available for                            emergencies. The signs and symptoms of potential                            delayed complications were discussed with the                            patient. Return to normal activities tomorrow.                            Written discharge instructions were provided to the                            patient.                           - Resume previous diet.                           - Continue present medications.                           - Await pathology results. Remo Lipps P. Morrell Fluke, MD 08/11/2019 11:39:10 AM This report has been signed electronically.

## 2019-08-11 NOTE — Progress Notes (Signed)
Pt's states no medical or surgical changes since previsit or office visit.  CS - temp CW -vitals

## 2019-08-15 ENCOUNTER — Telehealth: Payer: Self-pay | Admitting: *Deleted

## 2019-08-15 ENCOUNTER — Telehealth: Payer: Self-pay | Admitting: Gastroenterology

## 2019-08-15 NOTE — Telephone Encounter (Signed)
First attempt, left VM.  

## 2019-08-15 NOTE — Telephone Encounter (Signed)
Patient returned the call he says he is doing well advise patient if he has any questions or concerns to please call the office back.

## 2019-08-15 NOTE — Telephone Encounter (Signed)
  Follow up Call-  Call back number 08/11/2019  Post procedure Call Back phone  # [phone]  Permission to leave phone message Yes  Some recent data might be hidden     Patient questions:  Do you have a fever, pain , or abdominal swelling? No. Pain Score  0 *  Have you tolerated food without any problems? Yes.    Have you been able to return to your normal activities? Yes.    Do you have any questions about your discharge instructions: Diet   No. Medications  No. Follow up visit  No.  Do you have questions or concerns about your Care? No.  Actions: * If pain score is 4 or above: No action needed, pain <4  1. Have you developed a fever since your procedure? NO  2.   Have you had an respiratory symptoms (SOB or cough) since your procedure? NO  3.   Have you tested positive for COVID 19 since your procedure NO  4.   Have you had any family members/close contacts diagnosed with the COVID 19 since your procedure?  NO   If yes to any of these questions please route to Joylene John, RN and Erenest Rasher, RN

## 2019-08-29 ENCOUNTER — Other Ambulatory Visit: Payer: Self-pay | Admitting: Family Medicine

## 2019-09-26 ENCOUNTER — Other Ambulatory Visit: Payer: Self-pay | Admitting: Family Medicine

## 2019-09-28 NOTE — Telephone Encounter (Signed)
Last fill 05/31/2019 Last OV 01/30/2019 Ok to fill?

## 2019-10-04 ENCOUNTER — Other Ambulatory Visit: Payer: Self-pay | Admitting: Family Medicine

## 2019-10-21 ENCOUNTER — Other Ambulatory Visit: Payer: Self-pay | Admitting: Family Medicine

## 2019-11-14 ENCOUNTER — Other Ambulatory Visit: Payer: Self-pay | Admitting: Family Medicine

## 2019-11-27 ENCOUNTER — Other Ambulatory Visit: Payer: Self-pay | Admitting: Family Medicine

## 2019-12-04 ENCOUNTER — Encounter: Payer: Self-pay | Admitting: Family Medicine

## 2019-12-04 NOTE — Telephone Encounter (Signed)
Last filled 09/28/2019 Last OV 01/30/2019  Ok to fill?

## 2019-12-04 NOTE — Telephone Encounter (Signed)
No, since this is controlled he will need to wait and get it when he gets back to [facility]

## 2019-12-28 ENCOUNTER — Other Ambulatory Visit: Payer: Self-pay | Admitting: Family Medicine

## 2020-01-19 ENCOUNTER — Other Ambulatory Visit: Payer: Self-pay | Admitting: Family Medicine

## 2020-01-30 ENCOUNTER — Other Ambulatory Visit: Payer: Self-pay

## 2020-01-31 ENCOUNTER — Ambulatory Visit (INDEPENDENT_AMBULATORY_CARE_PROVIDER_SITE_OTHER): Payer: No Typology Code available for payment source | Admitting: Family Medicine

## 2020-01-31 ENCOUNTER — Encounter: Payer: Self-pay | Admitting: Family Medicine

## 2020-01-31 VITALS — BP 128/90 | HR 72 | Temp 97.8°F | Ht 71.0 in | Wt 193.8 lb

## 2020-01-31 DIAGNOSIS — Z0000 Encounter for general adult medical examination without abnormal findings: Secondary | ICD-10-CM

## 2020-01-31 DIAGNOSIS — Z23 Encounter for immunization: Secondary | ICD-10-CM

## 2020-01-31 DIAGNOSIS — Z209 Contact with and (suspected) exposure to unspecified communicable disease: Secondary | ICD-10-CM | POA: Diagnosis not present

## 2020-01-31 MED ORDER — ATORVASTATIN CALCIUM 20 MG PO TABS
20.0000 mg | ORAL_TABLET | Freq: Every day | ORAL | 3 refills | Status: DC
Start: 2020-01-31 — End: 2021-01-30

## 2020-01-31 MED ORDER — TESTOSTERONE 20.25 MG/ACT (1.62%) TD GEL: TRANSDERMAL | 5 refills | Status: DC

## 2020-01-31 NOTE — Addendum Note (Signed)
Addended by: Matilde Sprang on: 01/31/2020 10:44 AM   Modules accepted: Orders

## 2020-01-31 NOTE — Addendum Note (Signed)
Addended by: Marrion Coy on: 01/31/2020 10:13 AM   Modules accepted: Orders

## 2020-01-31 NOTE — Progress Notes (Signed)
   Subjective:    Patient ID: Anthony Ayers, male    DOB: 1970-06-20, 49 y.o.   MRN: [id number]  HPI Here for a well exam. He feels fine.   Review of Systems  Constitutional: Negative.   HENT: Negative.   Eyes: Negative.   Respiratory: Negative.   Cardiovascular: Negative.   Gastrointestinal: Negative.   Genitourinary: Negative.   Musculoskeletal: Negative.   Skin: Negative.   Neurological: Negative.   Psychiatric/Behavioral: Negative.        Objective:   Physical Exam Constitutional:      General: He is not in acute distress.    Appearance: He is well-developed. He is not diaphoretic.  HENT:     Head: Normocephalic and atraumatic.     Right Ear: External ear normal.     Left Ear: External ear normal.     Nose: Nose normal.     Mouth/Throat:     Pharynx: No oropharyngeal exudate.  Eyes:     General: No scleral icterus.       Right eye: No discharge.        Left eye: No discharge.     Conjunctiva/sclera: Conjunctivae normal.     Pupils: Pupils are equal, round, and reactive to light.  Neck:     Thyroid: No thyromegaly.     Vascular: No JVD.     Trachea: No tracheal deviation.  Cardiovascular:     Rate and Rhythm: Normal rate and regular rhythm.     Heart sounds: Normal heart sounds. No murmur heard.  No friction rub. No gallop.   Pulmonary:     Effort: Pulmonary effort is normal. No respiratory distress.     Breath sounds: Normal breath sounds. No wheezing or rales.  Chest:     Chest wall: No tenderness.  Abdominal:     General: Bowel sounds are normal. There is no distension.     Palpations: Abdomen is soft. There is no mass.     Tenderness: There is no abdominal tenderness. There is no guarding or rebound.  Genitourinary:    Penis: Normal. No tenderness.      Testes: Normal.     Prostate: Normal.     Rectum: Normal. Guaiac result negative.  Musculoskeletal:        General: No tenderness. Normal range of motion.     Cervical back: Neck supple.    Lymphadenopathy:     Cervical: No cervical adenopathy.  Skin:    General: Skin is warm and dry.     Coloration: Skin is not pale.     Findings: No erythema or rash.  Neurological:     Mental Status: He is alert and oriented to person, place, and time.     Cranial Nerves: No cranial nerve deficit.     Motor: No abnormal muscle tone.     Coordination: Coordination normal.     Deep Tendon Reflexes: Reflexes are normal and symmetric. Reflexes normal.  Psychiatric:        Behavior: Behavior normal.        Thought Content: Thought content normal.        Judgment: Judgment normal.           Assessment & Plan:  Well exam. We discussed diet and exercise. Get fasting labs. Alysia Penna, MD

## 2020-01-31 NOTE — Addendum Note (Signed)
Addended by: Marrion Coy on: 01/31/2020 10:14 AM   Modules accepted: Orders

## 2020-02-01 LAB — CBC WITH DIFFERENTIAL/PLATELET
Absolute Monocytes: 464 cells/uL (ref 200–950)
Basophils Absolute: 92 cells/uL (ref 0–200)
Basophils Relative: 1.7 %
Eosinophils Absolute: 270 cells/uL (ref 15–500)
Eosinophils Relative: 5 %
HCT: 43 % (ref 38.5–50.0)
Hemoglobin: 14.5 g/dL (ref 13.2–17.1)
Lymphs Abs: 2111 cells/uL (ref 850–3900)
MCH: 30.9 pg (ref 27.0–33.0)
MCHC: 33.7 g/dL (ref 32.0–36.0)
MCV: 91.7 fL (ref 80.0–100.0)
MPV: 9.7 fL (ref 7.5–12.5)
Monocytes Relative: 8.6 %
Neutro Abs: 2462 cells/uL (ref 1500–7800)
Neutrophils Relative %: 45.6 %
Platelets: 254 10*3/uL (ref 140–400)
RBC: 4.69 10*6/uL (ref 4.20–5.80)
RDW: 12.4 % (ref 11.0–15.0)
Total Lymphocyte: 39.1 %
WBC: 5.4 10*3/uL (ref 3.8–10.8)

## 2020-02-01 LAB — HEPATIC FUNCTION PANEL
AG Ratio: 2 (calc) (ref 1.0–2.5)
ALT: 19 U/L (ref 9–46)
AST: 19 U/L (ref 10–35)
Albumin: 4.7 g/dL (ref 3.6–5.1)
Alkaline phosphatase (APISO): 47 U/L (ref 35–144)
Bilirubin, Direct: 0.1 mg/dL (ref 0.0–0.2)
Globulin: 2.3 g/dL (calc) (ref 1.9–3.7)
Indirect Bilirubin: 0.4 mg/dL (calc) (ref 0.2–1.2)
Total Bilirubin: 0.5 mg/dL (ref 0.2–1.2)
Total Protein: 7 g/dL (ref 6.1–8.1)

## 2020-02-01 LAB — LIPID PANEL
Cholesterol: 150 mg/dL (ref <200)
HDL: 49 mg/dL (ref >=40)
LDL Cholesterol (Calc): 87 mg/dL (calc)
Non-HDL Cholesterol (Calc): 101 mg/dL (calc) (ref <130)
Total CHOL/HDL Ratio: 3.1 (calc) (ref <5.0)
Triglycerides: 59 mg/dL (ref <150)

## 2020-02-01 LAB — BASIC METABOLIC PANEL
BUN: 12 mg/dL (ref 7–25)
CO2: 29 mmol/L (ref 20–32)
Calcium: 9.6 mg/dL (ref 8.6–10.3)
Chloride: 103 mmol/L (ref 98–110)
Creat: 0.88 mg/dL (ref 0.70–1.33)
Glucose, Bld: 91 mg/dL (ref 65–99)
Potassium: 4.2 mmol/L (ref 3.5–5.3)
Sodium: 139 mmol/L (ref 135–146)

## 2020-02-01 LAB — TESTOSTERONE: Testosterone: 1011 ng/dL — ABNORMAL HIGH (ref 250–827)

## 2020-02-01 LAB — PSA: PSA: 2.41 ng/mL (ref <=4.0)

## 2020-02-01 LAB — TSH: TSH: 1.48 mIU/L (ref 0.40–4.50)

## 2020-02-01 LAB — SARS-COV-2 ANTIBODY(IGG)SPIKE,SEMI-QUANTITATIVE: SARS COV1 AB(IGG)SPIKE,SEMI QN: >20 index — ABNORMAL HIGH (ref <1.00)

## 2020-02-12 ENCOUNTER — Other Ambulatory Visit: Payer: Self-pay | Admitting: Family Medicine

## 2020-03-07 ENCOUNTER — Other Ambulatory Visit: Payer: Self-pay | Admitting: Family Medicine

## 2020-04-16 ENCOUNTER — Other Ambulatory Visit: Payer: Self-pay | Admitting: Family Medicine

## 2020-05-14 ENCOUNTER — Encounter: Payer: Self-pay | Admitting: Family Medicine

## 2020-05-21 NOTE — Telephone Encounter (Signed)
As for the testosterone, I would leave his dose where is is now. We will check another level at his next physical. As for the Covid booster, we do not have any. I suggest he contact his pharmacy about this

## 2020-06-12 ENCOUNTER — Other Ambulatory Visit: Payer: Self-pay | Admitting: Family Medicine

## 2020-07-29 ENCOUNTER — Other Ambulatory Visit: Payer: Self-pay | Admitting: Family Medicine

## 2020-10-21 ENCOUNTER — Other Ambulatory Visit: Payer: Self-pay | Admitting: Family Medicine

## 2020-10-21 NOTE — Telephone Encounter (Signed)
Last refill- 06/12/20 Last office visit-01/31/20  Next appointment- 01/30/21

## 2020-11-15 ENCOUNTER — Other Ambulatory Visit: Payer: Self-pay

## 2020-11-15 ENCOUNTER — Emergency Department (HOSPITAL_BASED_OUTPATIENT_CLINIC_OR_DEPARTMENT_OTHER)
Admission: EM | Admit: 2020-11-15 | Discharge: 2020-11-15 | Disposition: A | Discharge status: Home / Self Care | Payer: No Typology Code available for payment source | Attending: Emergency Medicine | Admitting: Emergency Medicine

## 2020-11-15 ENCOUNTER — Emergency Department (HOSPITAL_BASED_OUTPATIENT_CLINIC_OR_DEPARTMENT_OTHER): Payer: No Typology Code available for payment source

## 2020-11-15 ENCOUNTER — Encounter (HOSPITAL_BASED_OUTPATIENT_CLINIC_OR_DEPARTMENT_OTHER): Payer: Self-pay | Admitting: *Deleted

## 2020-11-15 DIAGNOSIS — M51369 Other intervertebral disc degeneration, lumbar region without mention of lumbar back pain or lower extremity pain: Secondary | ICD-10-CM

## 2020-11-15 DIAGNOSIS — R1030 Lower abdominal pain, unspecified: Secondary | ICD-10-CM

## 2020-11-15 DIAGNOSIS — M5136 Other intervertebral disc degeneration, lumbar region: Secondary | ICD-10-CM | POA: Insufficient documentation

## 2020-11-15 DIAGNOSIS — R1031 Right lower quadrant pain: Secondary | ICD-10-CM | POA: Diagnosis not present

## 2020-11-15 DIAGNOSIS — Z87891 Personal history of nicotine dependence: Secondary | ICD-10-CM | POA: Diagnosis not present

## 2020-11-15 LAB — COMPREHENSIVE METABOLIC PANEL
ALT: 22 U/L (ref 0–44)
AST: 25 U/L (ref 15–41)
Albumin: 4.7 g/dL (ref 3.5–5.0)
Alkaline Phosphatase: 40 U/L (ref 38–126)
Anion gap: 9 (ref 5–15)
BUN: 12 mg/dL (ref 6–20)
CO2: 28 mmol/L (ref 22–32)
Calcium: 9.6 mg/dL (ref 8.9–10.3)
Chloride: 102 mmol/L (ref 98–111)
Creatinine, Ser: 0.87 mg/dL (ref 0.61–1.24)
GFR, Estimated: >60 mL/min (ref >60)
Glucose, Bld: 131 mg/dL — ABNORMAL HIGH (ref 70–99)
Potassium: 4.1 mmol/L (ref 3.5–5.1)
Sodium: 139 mmol/L (ref 135–145)
Total Bilirubin: 0.8 mg/dL (ref 0.3–1.2)
Total Protein: 7.4 g/dL (ref 6.5–8.1)

## 2020-11-15 LAB — URINALYSIS, ROUTINE W REFLEX MICROSCOPIC
Bilirubin Urine: NEGATIVE
Glucose, UA: NEGATIVE mg/dL
Hgb urine dipstick: NEGATIVE
Ketones, ur: NEGATIVE mg/dL
Leukocytes,Ua: NEGATIVE
Nitrite: NEGATIVE
Protein, ur: NEGATIVE mg/dL
Specific Gravity, Urine: 1.006 (ref 1.005–1.030)
pH: 6 (ref 5.0–8.0)

## 2020-11-15 LAB — LIPASE, BLOOD: Lipase: 29 U/L (ref 11–51)

## 2020-11-15 LAB — CBC
HCT: 44 % (ref 39.0–52.0)
Hemoglobin: 14.8 g/dL (ref 13.0–17.0)
MCH: 30.4 pg (ref 26.0–34.0)
MCHC: 33.6 g/dL (ref 30.0–36.0)
MCV: 90.3 fL (ref 80.0–100.0)
Platelets: 248 10*3/uL (ref 150–400)
RBC: 4.87 MIL/uL (ref 4.22–5.81)
RDW: 12.5 % (ref 11.5–15.5)
WBC: 5.3 10*3/uL (ref 4.0–10.5)
nRBC: 0 % (ref 0.0–0.2)

## 2020-11-15 MED ORDER — PREDNISOLONE SODIUM PHOSPHATE 30 MG PO TBDP: ORAL_TABLET | ORAL | 0 refills | Status: DC

## 2020-11-15 MED ORDER — PREDNISONE 10 MG (21) PO TBPK: ORAL_TABLET | Freq: Every day | ORAL | 0 refills | Status: DC

## 2020-11-15 MED ORDER — SODIUM CHLORIDE 0.9 % IV BOLUS
1000.0000 mL | Freq: Once | INTRAVENOUS | Status: AC
  Administered 2020-11-15: 1000 mL via INTRAVENOUS

## 2020-11-15 MED ORDER — DEXAMETHASONE SODIUM PHOSPHATE 10 MG/ML IJ SOLN
10.0000 mg | Freq: Once | INTRAMUSCULAR | Status: AC
  Administered 2020-11-15: 10 mg via INTRAVENOUS
  Filled 2020-11-15: qty 1

## 2020-11-15 MED ORDER — PREDNISOLONE 15 MG/5ML PO SYRP: ORAL_SOLUTION | ORAL | 0 refills | Status: AC

## 2020-11-15 NOTE — ED Triage Notes (Signed)
Bilateral lower abdominal pain since Wednesday.  Denies vomiting or diarrhea.

## 2020-11-15 NOTE — ED Provider Notes (Signed)
[facility] Provider Note   CSN: [id number] Arrival date & time: 11/15/20  1157     History Chief Complaint  Patient presents with   Abdominal Pain    Anthony Ayers is a 50 y.o. male.  Pt presents to the ED today with lower abdominal pain.  Pt said he has been having pain since Wed., June 29.  He told the nurse it was bilateral, but tells me it is the RLQ.  Pt denies any other associated sx.      Past Medical History:  Diagnosis Date   Cataract    left   ED (erectile dysfunction)    Hyperlipidemia     Patient Active Problem List   Diagnosis Date Noted   Hypogonadism in male 01/30/2019   History of esophageal stricture 11/24/2016   HYPERLIPIDEMIA 07/26/2007   RETINAL HEMORRHAGE 07/26/2007    Past Surgical History:  Procedure Laterality Date   COLONOSCOPY  08/11/2019   per Dr. Havery Moros, adenomatous polyps, repeat in 3 yrs    ESOPHAGEAL DILATION  2008   times 2    EYE SURGERY Left    laser   VASECTOMY     WISDOM TOOTH EXTRACTION         Family History  Problem Relation Age of Onset   Colon cancer Neg Hx    Rectal cancer Neg Hx    Stomach cancer Neg Hx    Esophageal cancer Neg Hx     Social History   Tobacco Use   Smoking status: Former    Pack years: 0.00    Types: Cigarettes   Smokeless tobacco: Never   Tobacco comments:    social smoker  Vaping Use   Vaping Use: Never used  Substance Use Topics   Alcohol use: Yes    Alcohol/week: 8.0 standard drinks    Types: 8 Standard drinks or equivalent per week    Comment: socially   Drug use: No    Home Medications Prior to Admission medications   Medication Sig Start Date End Date Taking? Authorizing Provider  atorvastatin (LIPITOR) 20 MG tablet Take 1 tablet (20 mg total) by mouth daily. 01/31/20  Yes Laurey Morale, MD  predniSONE (STERAPRED UNI-PAK 21 TAB) 10 MG (21) TBPK tablet Take by mouth daily. Take 6 tabs by mouth daily  for 2 days, then 5 tabs for 2 days,  then 4 tabs for 2 days, then 3 tabs for 2 days, 2 tabs for 2 days, then 1 tab by mouth daily for 2 days 11/15/20  Yes Isla Pence, MD  Testosterone 20.25 MG/ACT (1.62%) GEL APPLY 4 PUMPS TO THE SKIN DAILY 10/21/20  Yes Laurey Morale, MD  sildenafil (VIAGRA) 100 MG tablet TAKE ONE TABLET BY MOUTH DAILY AS NEEDED FOR ERECTILE DYSFUNCTION 06/12/20   Laurey Morale, MD    Allergies    Ibuprofen  Review of Systems   Review of Systems  Gastrointestinal:  Positive for abdominal pain.  All other systems reviewed and are negative.  Physical Exam Updated Vital Signs BP 125/83 (BP Location: Left Arm)   Pulse 61   Temp 98.3 F (36.8 C)   Resp 16   Ht 5' 11.5" (1.816 m)   Wt 86.2 kg   SpO2 98%   BMI 26.13 kg/m   Physical Exam Vitals and nursing note reviewed.  Constitutional:      Appearance: He is well-developed.  HENT:     Head: Normocephalic and atraumatic.  Mouth/Throat:     Mouth: Mucous membranes are moist.     Pharynx: Oropharynx is clear.  Eyes:     Extraocular Movements: Extraocular movements intact.     Pupils: Pupils are equal, round, and reactive to light.  Cardiovascular:     Rate and Rhythm: Normal rate and regular rhythm.     Heart sounds: Normal heart sounds.  Pulmonary:     Effort: Pulmonary effort is normal.     Breath sounds: Normal breath sounds.  Abdominal:     General: Abdomen is flat. Bowel sounds are normal.     Palpations: Abdomen is soft.     Tenderness: There is abdominal tenderness in the right lower quadrant.  Skin:    General: Skin is warm.     Capillary Refill: Capillary refill takes less than 2 seconds.  Neurological:     General: No focal deficit present.     Mental Status: He is alert and oriented to person, place, and time.  Psychiatric:        Mood and Affect: Mood normal.        Behavior: Behavior normal.    ED Results / Procedures / Treatments   Labs (all labs ordered are listed, but only abnormal results are displayed) Labs  Reviewed  COMPREHENSIVE METABOLIC PANEL - Abnormal; Notable for the following components:      Result Value   Glucose, Bld 131 (*)    All other components within normal limits  URINALYSIS, ROUTINE W REFLEX MICROSCOPIC - Abnormal; Notable for the following components:   Color, Urine COLORLESS (*)    All other components within normal limits  LIPASE, BLOOD  CBC    EKG None  Radiology CT Renal Stone Study  Result Date: 11/15/2020 CLINICAL DATA:  Onset lower abdominal pain 11/13/2020. EXAM: CT ABDOMEN AND PELVIS WITHOUT CONTRAST TECHNIQUE: Multidetector CT imaging of the abdomen and pelvis was performed following the standard protocol without IV contrast. COMPARISON:  None. FINDINGS: Lower chest: Lung bases clear. No pleural or pericardial effusion. Heart size is normal. Hepatobiliary: No focal liver abnormality is seen. No gallstones, gallbladder wall thickening, or biliary dilatation. Pancreas: Unremarkable. No pancreatic ductal dilatation or surrounding inflammatory changes. Spleen: Normal in size without focal abnormality. Adrenals/Urinary Tract: Adrenal glands are unremarkable. Kidneys are normal, without renal calculi, focal lesion, or hydronephrosis. Bladder is unremarkable. Stomach/Bowel: Stomach is within normal limits. Appendix appears normal. No evidence of bowel wall thickening, distention, or inflammatory changes. Vascular/Lymphatic: No significant vascular findings are present. No enlarged abdominal or pelvic lymph nodes. Reproductive: Prostate is unremarkable. Other: None. Musculoskeletal: Loss of disc space height and endplate spurring are seen at L5-S1. There is a small amount of vacuum disc phenomenon at L4-5. No acute or focal abnormality. IMPRESSION: Lower lumbar degenerative change. The examination is otherwise negative. No finding to explain the patient's symptoms. Electronically Signed   By: Inge Rise M.D.   On: 11/15/2020 13:51    Procedures Procedures   Medications  Ordered in ED Medications  dexamethasone (DECADRON) injection 10 mg (has no administration in time range)  sodium chloride 0.9 % bolus 1,000 mL (1,000 mLs Intravenous New Bag/Given 11/15/20 1255)    ED Course  I have reviewed the triage vital signs and the nursing notes.  Pertinent labs & imaging results that were available during my care of the patient were reviewed by me and considered in my medical decision making (see chart for details).    MDM Rules/Calculators/A&P  Pt has not required any pain meds.  His labs and CT renal negative other than disc space narrowing in the lower lumbar region.  He could have a disc irritating his nerve causing his sx.  I will put pt on steroids and he is to f/u with pcp if sx are not improving. Final Clinical Impression(s) / ED Diagnoses Final diagnoses:  Lumbar degenerative disc disease  Lower abdominal pain    Rx / DC Orders ED Discharge Orders          Ordered    predniSONE (STERAPRED UNI-PAK 21 TAB) 10 MG (21) TBPK tablet  Daily        11/15/20 1404             Isla Pence, MD 11/15/20 1413

## 2020-12-17 ENCOUNTER — Other Ambulatory Visit: Payer: Self-pay | Admitting: Family Medicine

## 2020-12-18 NOTE — Telephone Encounter (Signed)
Last ov- 01/31/20 Last refill- 10/21/20--1 refill   Physical scheduled- 01/30/21.  Can the patient receive a refill?

## 2021-01-08 ENCOUNTER — Other Ambulatory Visit: Payer: Self-pay | Admitting: Family Medicine

## 2021-01-10 NOTE — Telephone Encounter (Signed)
Last ov-01/31/20 Last refill- 12/18/20  Next office visit- 01/30/21.  Can this patient receive a refill?

## 2021-01-30 ENCOUNTER — Other Ambulatory Visit: Payer: Self-pay

## 2021-01-30 ENCOUNTER — Encounter: Payer: Self-pay | Admitting: Family Medicine

## 2021-01-30 ENCOUNTER — Ambulatory Visit (INDEPENDENT_AMBULATORY_CARE_PROVIDER_SITE_OTHER): Payer: No Typology Code available for payment source | Admitting: Family Medicine

## 2021-01-30 VITALS — BP 120/80 | HR 62 | Temp 98.1°F | Ht 71.0 in | Wt 195.0 lb

## 2021-01-30 DIAGNOSIS — Z23 Encounter for immunization: Secondary | ICD-10-CM

## 2021-01-30 DIAGNOSIS — E291 Testicular hypofunction: Secondary | ICD-10-CM

## 2021-01-30 DIAGNOSIS — Z0000 Encounter for general adult medical examination without abnormal findings: Secondary | ICD-10-CM

## 2021-01-30 LAB — HEMOGLOBIN A1C: Hgb A1c MFr Bld: 5.9 % (ref 4.6–6.5)

## 2021-01-30 LAB — HEPATIC FUNCTION PANEL
ALT: 22 U/L (ref 0–53)
AST: 23 U/L (ref 0–37)
Albumin: 4.8 g/dL (ref 3.5–5.2)
Alkaline Phosphatase: 49 U/L (ref 39–117)
Bilirubin, Direct: 0.1 mg/dL (ref 0.0–0.3)
Total Bilirubin: 0.7 mg/dL (ref 0.2–1.2)
Total Protein: 7.3 g/dL (ref 6.0–8.3)

## 2021-01-30 LAB — BASIC METABOLIC PANEL
BUN: 11 mg/dL (ref 6–23)
CO2: 31 mEq/L (ref 19–32)
Calcium: 9.9 mg/dL (ref 8.4–10.5)
Chloride: 102 mEq/L (ref 96–112)
Creatinine, Ser: 0.85 mg/dL (ref 0.40–1.50)
GFR: 99.81 mL/min (ref >60.00)
Glucose, Bld: 82 mg/dL (ref 70–99)
Potassium: 4.4 mEq/L (ref 3.5–5.1)
Sodium: 139 mEq/L (ref 135–145)

## 2021-01-30 LAB — CBC WITH DIFFERENTIAL/PLATELET
Basophils Absolute: 0.1 10*3/uL (ref 0.0–0.1)
Basophils Relative: 1 % (ref 0.0–3.0)
Eosinophils Absolute: 0.3 10*3/uL (ref 0.0–0.7)
Eosinophils Relative: 5.4 % — ABNORMAL HIGH (ref 0.0–5.0)
HCT: 44.8 % (ref 39.0–52.0)
Hemoglobin: 14.8 g/dL (ref 13.0–17.0)
Lymphocytes Relative: 36.9 % (ref 12.0–46.0)
Lymphs Abs: 2.1 10*3/uL (ref 0.7–4.0)
MCHC: 32.9 g/dL (ref 30.0–36.0)
MCV: 91.5 fl (ref 78.0–100.0)
Monocytes Absolute: 0.5 10*3/uL (ref 0.1–1.0)
Monocytes Relative: 8.7 % (ref 3.0–12.0)
Neutro Abs: 2.8 10*3/uL (ref 1.4–7.7)
Neutrophils Relative %: 48 % (ref 43.0–77.0)
Platelets: 272 10*3/uL (ref 150.0–400.0)
RBC: 4.89 Mil/uL (ref 4.22–5.81)
RDW: 13.3 % (ref 11.5–15.5)
WBC: 5.8 10*3/uL (ref 4.0–10.5)

## 2021-01-30 LAB — LIPID PANEL
Cholesterol: 142 mg/dL (ref 0–200)
HDL: 46.9 mg/dL (ref >39.00)
LDL Cholesterol: 81 mg/dL (ref 0–99)
NonHDL: 95.15
Total CHOL/HDL Ratio: 3
Triglycerides: 69 mg/dL (ref 0.0–149.0)
VLDL: 13.8 mg/dL (ref 0.0–40.0)

## 2021-01-30 LAB — TESTOSTERONE: Testosterone: 556.7 ng/dL (ref 300.00–890.00)

## 2021-01-30 LAB — PSA: PSA: 3.27 ng/mL (ref 0.10–4.00)

## 2021-01-30 LAB — TSH: TSH: 1.77 u[IU]/mL (ref 0.35–5.50)

## 2021-01-30 MED ORDER — ATORVASTATIN CALCIUM 20 MG PO TABS: 20.0000 mg | ORAL_TABLET | Freq: Every day | ORAL | 3 refills | Status: DC

## 2021-01-30 NOTE — Progress Notes (Signed)
Subjective:    Patient ID: Anthony Ayers, male    DOB: [birth year], 50 y.o.   MRN: [id number]  HPI Here for a well exam. He feels fine. He does mention some ringing in both ears that comes and goes. No change in his hearing.    Review of Systems  Constitutional: Negative.   HENT:  Positive for tinnitus.   Eyes: Negative.   Respiratory: Negative.    Cardiovascular: Negative.   Gastrointestinal: Negative.   Genitourinary: Negative.   Musculoskeletal: Negative.   Skin: Negative.   Neurological: Negative.   Psychiatric/Behavioral: Negative.        Objective:   Physical Exam Constitutional:      General: He is not in acute distress.    Appearance: Normal appearance. He is well-developed. He is not diaphoretic.  HENT:     Head: Normocephalic and atraumatic.     Right Ear: External ear normal.     Left Ear: External ear normal.     Nose: Nose normal.     Mouth/Throat:     Pharynx: No oropharyngeal exudate.  Eyes:     General: No scleral icterus.       Right eye: No discharge.        Left eye: No discharge.     Conjunctiva/sclera: Conjunctivae normal.     Pupils: Pupils are equal, round, and reactive to light.  Neck:     Thyroid: No thyromegaly.     Vascular: No JVD.     Trachea: No tracheal deviation.  Cardiovascular:     Rate and Rhythm: Normal rate and regular rhythm.     Heart sounds: Normal heart sounds. No murmur heard.   No friction rub. No gallop.  Pulmonary:     Effort: Pulmonary effort is normal. No respiratory distress.     Breath sounds: Normal breath sounds. No wheezing or rales.  Chest:     Chest wall: No tenderness.  Abdominal:     General: Bowel sounds are normal. There is no distension.     Palpations: Abdomen is soft. There is no mass.     Tenderness: There is no abdominal tenderness. There is no guarding or rebound.  Genitourinary:    Penis: Normal. No tenderness.      Testes: Normal.     Prostate: Normal.     Rectum: Normal. Guaiac result  negative.  Musculoskeletal:        General: No tenderness. Normal range of motion.     Cervical back: Neck supple.  Lymphadenopathy:     Cervical: No cervical adenopathy.  Skin:    General: Skin is warm and dry.     Coloration: Skin is not pale.     Findings: No erythema or rash.  Neurological:     Mental Status: He is alert and oriented to person, place, and time.     Cranial Nerves: No cranial nerve deficit.     Motor: No abnormal muscle tone.     Coordination: Coordination normal.     Deep Tendon Reflexes: Reflexes are normal and symmetric. Reflexes normal.  Psychiatric:        Behavior: Behavior normal.        Thought Content: Thought content normal.        Judgment: Judgment normal.          Assessment & Plan:  Well exam. We discussed diet and exercise. Get fasting labs. He has some tinnitus, and we discussed the nature of this. I advised him  to wear hearing protection when exposed to loud noises, such as when using the lawn mower, etc.  Alysia Penna, MD

## 2021-02-26 ENCOUNTER — Other Ambulatory Visit: Payer: Self-pay | Admitting: Family Medicine

## 2021-02-27 NOTE — Telephone Encounter (Signed)
Last O.V physical- 01/30/21 Can this patient receive refills?

## 2021-04-07 ENCOUNTER — Other Ambulatory Visit: Payer: Self-pay | Admitting: Family Medicine

## 2021-04-25 MED ORDER — TESTOSTERONE 20.25 MG/ACT (1.62%) TD GEL: TRANSDERMAL | 5 refills | Status: DC

## 2021-04-25 NOTE — Telephone Encounter (Signed)
Done

## 2021-06-23 ENCOUNTER — Other Ambulatory Visit: Payer: Self-pay | Admitting: Family Medicine

## 2021-08-14 ENCOUNTER — Encounter: Payer: Self-pay | Admitting: Family Medicine

## 2021-08-15 NOTE — Telephone Encounter (Signed)
I recommend he wait until he sees [place] and we can take care of all this  ?

## 2021-08-19 ENCOUNTER — Other Ambulatory Visit: Payer: Self-pay | Admitting: Family Medicine

## 2021-08-19 NOTE — Telephone Encounter (Signed)
Last OV- 01/30/21 ?Last refill- 04/24/2021--5 refills ? ?Next OV physical- 02/02/2022 ? ? ?

## 2021-12-16 ENCOUNTER — Other Ambulatory Visit: Payer: Self-pay | Admitting: Family Medicine

## 2021-12-17 ENCOUNTER — Other Ambulatory Visit: Payer: Self-pay | Admitting: Family Medicine

## 2021-12-18 ENCOUNTER — Encounter: Payer: Self-pay | Admitting: Family Medicine

## 2021-12-18 NOTE — Telephone Encounter (Signed)
Last OV- 01/30/2021 Last refill- 08/19/2020  Next OV- 02/02/2022

## 2021-12-19 NOTE — Telephone Encounter (Signed)
Please find out why this was denied

## 2021-12-22 ENCOUNTER — Telehealth: Payer: Self-pay | Admitting: Family Medicine

## 2021-12-22 NOTE — Telephone Encounter (Signed)
Please resend to [facility]

## 2021-12-22 NOTE — Telephone Encounter (Signed)
Pharmacy states refill Testosterone 20.25 MG/ACT (1.62%) GEL was denied, not sure why, seeking a new prescription or for the present to approved.

## 2021-12-23 NOTE — Telephone Encounter (Signed)
Pt called to ask about the status of his refill for the Testosterone 20.25 mg. (1.62%) gel.  Pt is completely out and would like to know if there is anything he can do to speed up the process.   Cell: [phone]

## 2021-12-23 NOTE — Telephone Encounter (Signed)
Sounds like we may need a PA. Please look into this

## 2021-12-24 NOTE — Telephone Encounter (Signed)
Requesting refill for Testosterone Gel.   Pharmacy updated.

## 2021-12-24 NOTE — Telephone Encounter (Signed)
Pharmacy calling regarding this refill, they have not received anything. Advised her a PA may be needed, she states it was filled on a discount card which bypasses the need for a PA. Ailene Ravel at [place] [phone]

## 2021-12-25 MED ORDER — TESTOSTERONE 20.25 MG/ACT (1.62%) TD GEL: TRANSDERMAL | 5 refills | Status: DC

## 2021-12-25 NOTE — Telephone Encounter (Signed)
Noted  

## 2021-12-25 NOTE — Telephone Encounter (Signed)
I sent in the refill.

## 2021-12-25 NOTE — Addendum Note (Signed)
Addended by: Alysia Penna A on: 12/25/2021 07:49 AM   Modules accepted: Orders

## 2021-12-26 ENCOUNTER — Telehealth: Payer: Self-pay | Admitting: Family Medicine

## 2021-12-26 NOTE — Telephone Encounter (Signed)
Called [facility] prescription was received for Testosterone on 12/25/21, and prescription is filled and currently ready for pick up.  Spoke with patient made aware.

## 2021-12-26 NOTE — Telephone Encounter (Signed)
Pt is totally out of Testosterone 20.25 MG/ACT (1.62%) GEL  has CPE scheduled for 02/02/22. Requesting a fill to get him thru to his appt

## 2022-01-01 NOTE — Telephone Encounter (Signed)
Spoke with pt pharmacy states that Rx was filled with a discount card and pt picked up on 12/26/2021

## 2022-02-02 ENCOUNTER — Ambulatory Visit (INDEPENDENT_AMBULATORY_CARE_PROVIDER_SITE_OTHER): Payer: Self-pay | Admitting: Family Medicine

## 2022-02-02 ENCOUNTER — Encounter: Payer: Self-pay | Admitting: Family Medicine

## 2022-02-02 VITALS — BP 118/78 | HR 56 | Temp 97.9°F | Ht 70.75 in | Wt 192.0 lb

## 2022-02-02 DIAGNOSIS — Z23 Encounter for immunization: Secondary | ICD-10-CM

## 2022-02-02 DIAGNOSIS — Z0000 Encounter for general adult medical examination without abnormal findings: Secondary | ICD-10-CM

## 2022-02-02 LAB — CBC WITH DIFFERENTIAL/PLATELET
Basophils Absolute: 0.1 10*3/uL (ref 0.0–0.1)
Basophils Relative: 1 % (ref 0.0–3.0)
Eosinophils Absolute: 0.3 10*3/uL (ref 0.0–0.7)
Eosinophils Relative: 4.6 % (ref 0.0–5.0)
HCT: 43.3 % (ref 39.0–52.0)
Hemoglobin: 14.8 g/dL (ref 13.0–17.0)
Lymphocytes Relative: 28.4 % (ref 12.0–46.0)
Lymphs Abs: 1.7 10*3/uL (ref 0.7–4.0)
MCHC: 34.2 g/dL (ref 30.0–36.0)
MCV: 89.9 fl (ref 78.0–100.0)
Monocytes Absolute: 0.6 10*3/uL (ref 0.1–1.0)
Monocytes Relative: 9.3 % (ref 3.0–12.0)
Neutro Abs: 3.4 10*3/uL (ref 1.4–7.7)
Neutrophils Relative %: 56.7 % (ref 43.0–77.0)
Platelets: 268 10*3/uL (ref 150.0–400.0)
RBC: 4.81 Mil/uL (ref 4.22–5.81)
RDW: 13.1 % (ref 11.5–15.5)
WBC: 6 10*3/uL (ref 4.0–10.5)

## 2022-02-02 LAB — TSH: TSH: 1.49 u[IU]/mL (ref 0.35–5.50)

## 2022-02-02 LAB — LIPID PANEL
Cholesterol: 136 mg/dL (ref 0–200)
HDL: 48.3 mg/dL (ref >39.00)
LDL Cholesterol: 71 mg/dL (ref 0–99)
NonHDL: 87.83
Total CHOL/HDL Ratio: 3
Triglycerides: 83 mg/dL (ref 0.0–149.0)
VLDL: 16.6 mg/dL (ref 0.0–40.0)

## 2022-02-02 LAB — HEPATIC FUNCTION PANEL
ALT: 18 U/L (ref 0–53)
AST: 23 U/L (ref 0–37)
Albumin: 4.7 g/dL (ref 3.5–5.2)
Alkaline Phosphatase: 52 U/L (ref 39–117)
Bilirubin, Direct: 0.1 mg/dL (ref 0.0–0.3)
Total Bilirubin: 0.7 mg/dL (ref 0.2–1.2)
Total Protein: 7.3 g/dL (ref 6.0–8.3)

## 2022-02-02 LAB — BASIC METABOLIC PANEL
BUN: 13 mg/dL (ref 6–23)
CO2: 29 mEq/L (ref 19–32)
Calcium: 9.9 mg/dL (ref 8.4–10.5)
Chloride: 101 mEq/L (ref 96–112)
Creatinine, Ser: 0.92 mg/dL (ref 0.40–1.50)
GFR: 94.87 mL/min (ref >60.00)
Glucose, Bld: 87 mg/dL (ref 70–99)
Potassium: 4.3 mEq/L (ref 3.5–5.1)
Sodium: 139 mEq/L (ref 135–145)

## 2022-02-02 LAB — TESTOSTERONE: Testosterone: 364.82 ng/dL (ref 300.00–890.00)

## 2022-02-02 LAB — PSA: PSA: 2.98 ng/mL (ref 0.10–4.00)

## 2022-02-02 LAB — HEMOGLOBIN A1C: Hgb A1c MFr Bld: 5.9 % (ref 4.6–6.5)

## 2022-02-02 MED ORDER — ATORVASTATIN CALCIUM 20 MG PO TABS: 20.0000 mg | ORAL_TABLET | Freq: Every day | ORAL | 3 refills | Status: DC

## 2022-02-02 NOTE — Progress Notes (Signed)
   Subjective:    Patient ID: Anthony Ayers, male    DOB: [birth year], 51 y.o.   MRN: [id number]  HPI Here for a well exam. He feels great.    Review of Systems  Constitutional: Negative.   HENT: Negative.    Eyes: Negative.   Respiratory: Negative.    Cardiovascular: Negative.   Gastrointestinal: Negative.   Genitourinary: Negative.   Musculoskeletal: Negative.   Skin: Negative.   Neurological: Negative.   Psychiatric/Behavioral: Negative.         Objective:   Physical Exam Constitutional:      General: He is not in acute distress.    Appearance: Normal appearance. He is well-developed. He is not diaphoretic.  HENT:     Head: Normocephalic and atraumatic.     Right Ear: External ear normal.     Left Ear: External ear normal.     Nose: Nose normal.     Mouth/Throat:     Pharynx: No oropharyngeal exudate.  Eyes:     General: No scleral icterus.       Right eye: No discharge.        Left eye: No discharge.     Conjunctiva/sclera: Conjunctivae normal.     Pupils: Pupils are equal, round, and reactive to light.  Neck:     Thyroid: No thyromegaly.     Vascular: No JVD.     Trachea: No tracheal deviation.  Cardiovascular:     Rate and Rhythm: Normal rate and regular rhythm.     Heart sounds: Normal heart sounds. No murmur heard.    No friction rub. No gallop.  Pulmonary:     Effort: Pulmonary effort is normal. No respiratory distress.     Breath sounds: Normal breath sounds. No wheezing or rales.  Chest:     Chest wall: No tenderness.  Abdominal:     General: Bowel sounds are normal. There is no distension.     Palpations: Abdomen is soft. There is no mass.     Tenderness: There is no abdominal tenderness. There is no guarding or rebound.  Genitourinary:    Penis: Normal. No tenderness.      Testes: Normal.     Prostate: Normal.     Rectum: Normal. Guaiac result negative.  Musculoskeletal:        General: No tenderness. Normal range of motion.     Cervical  back: Neck supple.  Lymphadenopathy:     Cervical: No cervical adenopathy.  Skin:    General: Skin is warm and dry.     Coloration: Skin is not pale.     Findings: No erythema or rash.  Neurological:     Mental Status: He is alert and oriented to person, place, and time.     Cranial Nerves: No cranial nerve deficit.     Motor: No abnormal muscle tone.     Coordination: Coordination normal.     Deep Tendon Reflexes: Reflexes are normal and symmetric. Reflexes normal.  Psychiatric:        Behavior: Behavior normal.        Thought Content: Thought content normal.        Judgment: Judgment normal.           Assessment & Plan:  Well exam. We discussed diet and exercise. Get fasting labs. Alysia Penna, MD

## 2022-04-06 ENCOUNTER — Other Ambulatory Visit: Payer: Self-pay | Admitting: Family Medicine

## 2022-04-27 ENCOUNTER — Other Ambulatory Visit: Payer: Self-pay | Admitting: Family Medicine

## 2022-04-27 NOTE — Telephone Encounter (Signed)
Last refill-12/25/21 Last OV CPE-02/02/22  Next OV-02-08-23

## 2022-07-16 ENCOUNTER — Encounter: Payer: Self-pay | Admitting: Gastroenterology

## 2022-09-01 ENCOUNTER — Other Ambulatory Visit: Payer: Self-pay | Admitting: Family Medicine

## 2022-09-01 NOTE — Telephone Encounter (Signed)
Done

## 2022-09-01 NOTE — Telephone Encounter (Signed)
Pt LOV was on 02/02/22 Last refill was done on 04/27/22 Please advise

## 2022-10-15 ENCOUNTER — Other Ambulatory Visit: Payer: Self-pay | Admitting: Family Medicine

## 2022-10-16 NOTE — Telephone Encounter (Signed)
Pt LOV / last Testosterone lab was on 02/02/22 Last refill was done on 09/01/22 Please advise

## 2022-11-30 ENCOUNTER — Other Ambulatory Visit: Payer: Self-pay | Admitting: Family Medicine

## 2022-12-31 ENCOUNTER — Encounter (INDEPENDENT_AMBULATORY_CARE_PROVIDER_SITE_OTHER): Payer: Self-pay

## 2023-01-13 ENCOUNTER — Other Ambulatory Visit: Payer: Self-pay | Admitting: Family Medicine

## 2023-01-13 NOTE — Telephone Encounter (Signed)
Pt LOV was on 02/02/22 Last refill was done on 10/20/22 Please advise

## 2023-01-14 ENCOUNTER — Other Ambulatory Visit: Payer: Self-pay | Admitting: Family Medicine

## 2023-01-15 NOTE — Telephone Encounter (Signed)
Pt LOV/ Last Testosterone lab was on 02/02/22 Last refill was done on 10/20/22 Please advise

## 2023-01-20 ENCOUNTER — Other Ambulatory Visit: Payer: Self-pay | Admitting: Family Medicine

## 2023-01-21 ENCOUNTER — Encounter: Payer: Self-pay | Admitting: Family Medicine

## 2023-01-21 MED ORDER — TESTOSTERONE 1.62 % TD GEL: TRANSDERMAL | 0 refills | Status: DC

## 2023-01-21 NOTE — Telephone Encounter (Signed)
Pt Last refill was done on 10/20/22 Pt has a CPE/ Labs scheduled for 02/08/23 Requesting for a refill to last him to his appointment Please advise

## 2023-01-21 NOTE — Addendum Note (Signed)
Addended by: Gershon Crane A on: 01/21/2023 12:41 PM   Modules accepted: Orders

## 2023-01-21 NOTE — Telephone Encounter (Signed)
Done

## 2023-02-08 ENCOUNTER — Ambulatory Visit (INDEPENDENT_AMBULATORY_CARE_PROVIDER_SITE_OTHER): Payer: 59 | Admitting: Family Medicine

## 2023-02-08 ENCOUNTER — Encounter: Payer: Self-pay | Admitting: Family Medicine

## 2023-02-08 VITALS — BP 118/78 | HR 73 | Temp 97.7°F | Ht 70.5 in | Wt 197.0 lb

## 2023-02-08 DIAGNOSIS — Z0000 Encounter for general adult medical examination without abnormal findings: Secondary | ICD-10-CM | POA: Diagnosis not present

## 2023-02-08 DIAGNOSIS — Z13220 Encounter for screening for lipoid disorders: Secondary | ICD-10-CM | POA: Diagnosis not present

## 2023-02-08 DIAGNOSIS — Z23 Encounter for immunization: Secondary | ICD-10-CM

## 2023-02-08 DIAGNOSIS — Z131 Encounter for screening for diabetes mellitus: Secondary | ICD-10-CM | POA: Diagnosis not present

## 2023-02-08 DIAGNOSIS — Z86010 Personal history of colonic polyps: Secondary | ICD-10-CM | POA: Diagnosis not present

## 2023-02-08 DIAGNOSIS — E291 Testicular hypofunction: Secondary | ICD-10-CM

## 2023-02-08 LAB — HEPATIC FUNCTION PANEL
ALT: 21 U/L (ref 0–53)
AST: 28 U/L (ref 0–37)
Albumin: 4.6 g/dL (ref 3.5–5.2)
Alkaline Phosphatase: 48 U/L (ref 39–117)
Bilirubin, Direct: 0.1 mg/dL (ref 0.0–0.3)
Total Bilirubin: 0.6 mg/dL (ref 0.2–1.2)
Total Protein: 7.2 g/dL (ref 6.0–8.3)

## 2023-02-08 LAB — BASIC METABOLIC PANEL
BUN: 14 mg/dL (ref 6–23)
CO2: 30 mEq/L (ref 19–32)
Calcium: 9.8 mg/dL (ref 8.4–10.5)
Chloride: 102 mEq/L (ref 96–112)
Creatinine, Ser: 0.92 mg/dL (ref 0.40–1.50)
GFR: 94.2 mL/min (ref >60.00)
Glucose, Bld: 93 mg/dL (ref 70–99)
Potassium: 4 mEq/L (ref 3.5–5.1)
Sodium: 139 mEq/L (ref 135–145)

## 2023-02-08 LAB — HEMOGLOBIN A1C: Hgb A1c MFr Bld: 6 % (ref 4.6–6.5)

## 2023-02-08 LAB — CBC WITH DIFFERENTIAL/PLATELET
Basophils Absolute: 0.1 10*3/uL (ref 0.0–0.1)
Basophils Relative: 1.2 % (ref 0.0–3.0)
Eosinophils Absolute: 0.2 10*3/uL (ref 0.0–0.7)
Eosinophils Relative: 4.5 % (ref 0.0–5.0)
HCT: 42.8 % (ref 39.0–52.0)
Hemoglobin: 14 g/dL (ref 13.0–17.0)
Lymphocytes Relative: 33.8 % (ref 12.0–46.0)
Lymphs Abs: 1.8 10*3/uL (ref 0.7–4.0)
MCHC: 32.8 g/dL (ref 30.0–36.0)
MCV: 90.6 fl (ref 78.0–100.0)
Monocytes Absolute: 0.6 10*3/uL (ref 0.1–1.0)
Monocytes Relative: 10.6 % (ref 3.0–12.0)
Neutro Abs: 2.7 10*3/uL (ref 1.4–7.7)
Neutrophils Relative %: 49.9 % (ref 43.0–77.0)
Platelets: 280 10*3/uL (ref 150.0–400.0)
RBC: 4.73 Mil/uL (ref 4.22–5.81)
RDW: 12.9 % (ref 11.5–15.5)
WBC: 5.4 10*3/uL (ref 4.0–10.5)

## 2023-02-08 LAB — PSA: PSA: 2.48 ng/mL (ref 0.10–4.00)

## 2023-02-08 LAB — LIPID PANEL
Cholesterol: 147 mg/dL (ref 0–200)
HDL: 53.4 mg/dL (ref >39.00)
LDL Cholesterol: 80 mg/dL (ref 0–99)
NonHDL: 93.73
Total CHOL/HDL Ratio: 3
Triglycerides: 71 mg/dL (ref 0.0–149.0)
VLDL: 14.2 mg/dL (ref 0.0–40.0)

## 2023-02-08 LAB — TESTOSTERONE: Testosterone: 434.01 ng/dL (ref 300.00–890.00)

## 2023-02-08 MED ORDER — ATORVASTATIN CALCIUM 20 MG PO TABS: 20.0000 mg | ORAL_TABLET | Freq: Every day | ORAL | 3 refills | Status: DC

## 2023-02-08 MED ORDER — TESTOSTERONE 1.62 % TD GEL: TRANSDERMAL | 5 refills | Status: DC

## 2023-02-08 NOTE — Progress Notes (Signed)
Subjective:    Patient ID: Anthony Ayers, male    DOB: June 26, 1970, 52 y.o.   MRN: [id number]  HPI Here for a well exam. He feels fine.    Review of Systems  Constitutional: Negative.   HENT: Negative.    Eyes: Negative.   Respiratory: Negative.    Cardiovascular: Negative.   Gastrointestinal: Negative.   Genitourinary: Negative.   Musculoskeletal: Negative.   Skin: Negative.   Neurological: Negative.   Psychiatric/Behavioral: Negative.         Objective:   Physical Exam Constitutional:      General: He is not in acute distress.    Appearance: Normal appearance. He is well-developed. He is not diaphoretic.  HENT:     Head: Normocephalic and atraumatic.     Right Ear: External ear normal.     Left Ear: External ear normal.     Nose: Nose normal.     Mouth/Throat:     Pharynx: No oropharyngeal exudate.  Eyes:     General: No scleral icterus.       Right eye: No discharge.        Left eye: No discharge.     Conjunctiva/sclera: Conjunctivae normal.     Pupils: Pupils are equal, round, and reactive to light.  Neck:     Thyroid: No thyromegaly.     Vascular: No JVD.     Trachea: No tracheal deviation.  Cardiovascular:     Rate and Rhythm: Normal rate and regular rhythm.     Pulses: Normal pulses.     Heart sounds: Normal heart sounds. No murmur heard.    No friction rub. No gallop.  Pulmonary:     Effort: Pulmonary effort is normal. No respiratory distress.     Breath sounds: Normal breath sounds. No wheezing or rales.  Chest:     Chest wall: No tenderness.  Abdominal:     General: Bowel sounds are normal. There is no distension.     Palpations: Abdomen is soft. There is no mass.     Tenderness: There is no abdominal tenderness. There is no guarding or rebound.  Genitourinary:    Penis: Normal. No tenderness.      Testes: Normal.     Prostate: Normal.     Rectum: Normal. Guaiac result negative.  Musculoskeletal:        General: No tenderness. Normal range  of motion.     Cervical back: Neck supple.  Lymphadenopathy:     Cervical: No cervical adenopathy.  Skin:    General: Skin is warm and dry.     Coloration: Skin is not pale.     Findings: No erythema or rash.  Neurological:     General: No focal deficit present.     Mental Status: He is alert and oriented to person, place, and time.     Cranial Nerves: No cranial nerve deficit.     Motor: No abnormal muscle tone.     Coordination: Coordination normal.     Deep Tendon Reflexes: Reflexes are normal and symmetric. Reflexes normal.  Psychiatric:        Mood and Affect: Mood normal.        Behavior: Behavior normal.        Thought Content: Thought content normal.        Judgment: Judgment normal.           Assessment & Plan:  Well exam. We discussed diet and exercise. Get fasting labs. He is  past due for a colonoscopy, so we will have GI contact him.  Gershon Crane, MD

## 2023-02-17 ENCOUNTER — Encounter: Payer: Self-pay | Admitting: Family Medicine

## 2023-03-09 DIAGNOSIS — D225 Melanocytic nevi of trunk: Secondary | ICD-10-CM | POA: Diagnosis not present

## 2023-03-09 DIAGNOSIS — L578 Other skin changes due to chronic exposure to nonionizing radiation: Secondary | ICD-10-CM | POA: Diagnosis not present

## 2023-03-09 DIAGNOSIS — L814 Other melanin hyperpigmentation: Secondary | ICD-10-CM | POA: Diagnosis not present

## 2023-03-09 DIAGNOSIS — L821 Other seborrheic keratosis: Secondary | ICD-10-CM | POA: Diagnosis not present

## 2023-03-09 DIAGNOSIS — D229 Melanocytic nevi, unspecified: Secondary | ICD-10-CM | POA: Diagnosis not present

## 2023-06-10 ENCOUNTER — Other Ambulatory Visit: Payer: Self-pay | Admitting: Family Medicine

## 2023-06-10 NOTE — Telephone Encounter (Signed)
Pt LOV was 02/08/23 Please advise

## 2023-07-22 ENCOUNTER — Encounter: Payer: Self-pay | Admitting: Gastroenterology

## 2023-07-27 ENCOUNTER — Other Ambulatory Visit: Payer: Self-pay | Admitting: Family Medicine

## 2023-08-24 ENCOUNTER — Ambulatory Visit (AMBULATORY_SURGERY_CENTER)

## 2023-08-24 VITALS — Ht 70.5 in | Wt 190.0 lb

## 2023-08-24 DIAGNOSIS — Z860100 Personal history of colon polyps, unspecified: Secondary | ICD-10-CM

## 2023-08-24 MED ORDER — NA SULFATE-K SULFATE-MG SULF 17.5-3.13-1.6 GM/177ML PO SOLN
1.0000 | Freq: Once | ORAL | 0 refills | Status: AC
Start: 2023-08-24 — End: 2023-08-24

## 2023-08-24 NOTE — Progress Notes (Signed)

## 2023-09-03 ENCOUNTER — Other Ambulatory Visit: Payer: Self-pay | Admitting: Family Medicine

## 2023-09-14 ENCOUNTER — Encounter: Payer: Self-pay | Admitting: Gastroenterology

## 2023-09-21 ENCOUNTER — Telehealth: Payer: Self-pay | Admitting: Gastroenterology

## 2023-09-21 NOTE — Telephone Encounter (Signed)
 Got it, thanks very much for letting me know.

## 2023-09-21 NOTE — Telephone Encounter (Signed)
 Good morning Dr. General Kenner,   We received a call from this patient wishing to reschedule his colonoscopy originally scheduled for 5/9 at 10:00 AM. Patient states his wife had a change in her schedule and she was his care partner and he would not have transportation. Patient was rescheduled for 5/20 at 10:00 Am with you.   Thank you.

## 2023-09-24 ENCOUNTER — Encounter: Admitting: Gastroenterology

## 2023-09-30 ENCOUNTER — Telehealth: Payer: Self-pay | Admitting: Gastroenterology

## 2023-09-30 NOTE — Telephone Encounter (Signed)
 Okay thanks for letting me know, he can call back to reschedule at his convenience.

## 2023-09-30 NOTE — Telephone Encounter (Signed)
 Good afternoon Dr. General Kenner, I received a call from this patient requesting to cancel his procedure for May the 20 th. Patient stated that he has injured his arm pretty badly and does not want to put his arm in a position where it could get worse. Patient procedure was cancelled. Please advise.

## 2023-10-05 ENCOUNTER — Encounter: Admitting: Gastroenterology

## 2023-12-17 ENCOUNTER — Encounter: Payer: Self-pay | Admitting: Gastroenterology

## 2023-12-17 ENCOUNTER — Ambulatory Visit

## 2023-12-17 ENCOUNTER — Telehealth: Payer: Self-pay

## 2023-12-17 VITALS — Ht 71.0 in | Wt 190.0 lb

## 2023-12-17 DIAGNOSIS — Z860100 Personal history of colon polyps, unspecified: Secondary | ICD-10-CM

## 2023-12-17 NOTE — Telephone Encounter (Signed)
 Completed pre visit

## 2023-12-17 NOTE — Progress Notes (Signed)

## 2023-12-17 NOTE — Telephone Encounter (Signed)
 No Show PV.  Went to voicemail and left message that I was trying to reach him for his 8:30 PV.  Will call back in 5 min.

## 2024-01-04 ENCOUNTER — Encounter: Admitting: Gastroenterology

## 2024-01-06 ENCOUNTER — Other Ambulatory Visit: Payer: Self-pay | Admitting: Family Medicine

## 2024-02-21 ENCOUNTER — Other Ambulatory Visit: Payer: Self-pay | Admitting: Family Medicine

## 2024-02-23 ENCOUNTER — Encounter: Payer: Self-pay | Admitting: Family Medicine

## 2024-02-24 ENCOUNTER — Other Ambulatory Visit: Payer: Self-pay

## 2024-02-24 MED ORDER — ATORVASTATIN CALCIUM 20 MG PO TABS: 20.0000 mg | ORAL_TABLET | Freq: Every day | ORAL | 0 refills | Status: AC

## 2024-02-24 NOTE — Telephone Encounter (Signed)
 Set up a physical since I have not seen him in over a year

## 2024-02-25 ENCOUNTER — Other Ambulatory Visit: Payer: Self-pay

## 2024-02-25 ENCOUNTER — Other Ambulatory Visit: Payer: Self-pay | Admitting: Family Medicine

## 2024-02-25 MED ORDER — TESTOSTERONE 1.62 % TD GEL: TRANSDERMAL | 0 refills | Status: DC

## 2024-02-25 MED ORDER — SILDENAFIL CITRATE 100 MG PO TABS: 100.0000 mg | ORAL_TABLET | ORAL | 0 refills | Status: DC | PRN

## 2024-02-25 NOTE — Telephone Encounter (Signed)
 Done

## 2024-02-25 NOTE — Telephone Encounter (Signed)
 Pt has appointment for CPE/ Medication refill on 03/09/24

## 2024-02-25 NOTE — Telephone Encounter (Signed)
 Pt is scheduled for CPE on 03/09/24, requests for refills on Sildenafil  and Testosterone  to avoid any missed doses while waiting on his CPE appointment. Please advise

## 2024-03-09 ENCOUNTER — Encounter: Payer: Self-pay | Admitting: Family Medicine

## 2024-03-09 ENCOUNTER — Ambulatory Visit: Payer: Self-pay | Admitting: Family Medicine

## 2024-03-09 ENCOUNTER — Ambulatory Visit: Admitting: Family Medicine

## 2024-03-09 VITALS — BP 98/64 | HR 61 | Temp 97.7°F | Ht 71.0 in | Wt 198.0 lb

## 2024-03-09 DIAGNOSIS — Z23 Encounter for immunization: Secondary | ICD-10-CM | POA: Diagnosis not present

## 2024-03-09 DIAGNOSIS — M25512 Pain in left shoulder: Secondary | ICD-10-CM | POA: Diagnosis not present

## 2024-03-09 DIAGNOSIS — G8929 Other chronic pain: Secondary | ICD-10-CM

## 2024-03-09 DIAGNOSIS — E291 Testicular hypofunction: Secondary | ICD-10-CM | POA: Diagnosis not present

## 2024-03-09 DIAGNOSIS — R972 Elevated prostate specific antigen [PSA]: Secondary | ICD-10-CM

## 2024-03-09 DIAGNOSIS — Z0000 Encounter for general adult medical examination without abnormal findings: Secondary | ICD-10-CM | POA: Diagnosis not present

## 2024-03-09 LAB — CBC WITH DIFFERENTIAL/PLATELET
Basophils Absolute: 0.1 K/uL (ref 0.0–0.1)
Basophils Relative: 0.8 % (ref 0.0–3.0)
Eosinophils Absolute: 0.2 K/uL (ref 0.0–0.7)
Eosinophils Relative: 2.6 % (ref 0.0–5.0)
HCT: 45.3 % (ref 39.0–52.0)
Hemoglobin: 15.2 g/dL (ref 13.0–17.0)
Lymphocytes Relative: 29.4 % (ref 12.0–46.0)
Lymphs Abs: 2.5 K/uL (ref 0.7–4.0)
MCHC: 33.5 g/dL (ref 30.0–36.0)
MCV: 89.5 fl (ref 78.0–100.0)
Monocytes Absolute: 0.8 K/uL (ref 0.1–1.0)
Monocytes Relative: 9.9 % (ref 3.0–12.0)
Neutro Abs: 4.8 K/uL (ref 1.4–7.7)
Neutrophils Relative %: 57.3 % (ref 43.0–77.0)
Platelets: 287 K/uL (ref 150.0–400.0)
RBC: 5.06 Mil/uL (ref 4.22–5.81)
RDW: 13 % (ref 11.5–15.5)
WBC: 8.4 K/uL (ref 4.0–10.5)

## 2024-03-09 LAB — LIPID PANEL
Cholesterol: 142 mg/dL (ref 0–200)
HDL: 43.5 mg/dL (ref >39.00)
LDL Cholesterol: 85 mg/dL (ref 0–99)
NonHDL: 98.67
Total CHOL/HDL Ratio: 3
Triglycerides: 67 mg/dL (ref 0.0–149.0)
VLDL: 13.4 mg/dL (ref 0.0–40.0)

## 2024-03-09 LAB — BASIC METABOLIC PANEL WITH GFR
BUN: 19 mg/dL (ref 6–23)
CO2: 29 meq/L (ref 19–32)
Calcium: 9.7 mg/dL (ref 8.4–10.5)
Chloride: 101 meq/L (ref 96–112)
Creatinine, Ser: 0.93 mg/dL (ref 0.40–1.50)
GFR: 92.28 mL/min (ref >60.00)
Glucose, Bld: 81 mg/dL (ref 70–99)
Potassium: 4.4 meq/L (ref 3.5–5.1)
Sodium: 139 meq/L (ref 135–145)

## 2024-03-09 LAB — TSH: TSH: 1.65 u[IU]/mL (ref 0.35–5.50)

## 2024-03-09 LAB — PSA: PSA: 5.67 ng/mL — ABNORMAL HIGH (ref 0.10–4.00)

## 2024-03-09 LAB — HEPATIC FUNCTION PANEL
ALT: 23 U/L (ref 0–53)
AST: 29 U/L (ref 0–37)
Albumin: 5 g/dL (ref 3.5–5.2)
Alkaline Phosphatase: 54 U/L (ref 39–117)
Bilirubin, Direct: 0.2 mg/dL (ref 0.0–0.3)
Total Bilirubin: 0.8 mg/dL (ref 0.2–1.2)
Total Protein: 7.5 g/dL (ref 6.0–8.3)

## 2024-03-09 LAB — TESTOSTERONE: Testosterone: 572.61 ng/dL (ref 300.00–890.00)

## 2024-03-09 LAB — HEMOGLOBIN A1C: Hgb A1c MFr Bld: 6.1 % (ref 4.6–6.5)

## 2024-03-09 NOTE — Progress Notes (Signed)
 Subjective:    Patient ID: Anthony Ayers, male    DOB: 1970/05/31, 53 y.o.   MRN: [id number]  HPI Here for a well exam. He only has one concern. For the past 6 months he has had frequent pains in the anterior left shoulder. He has never taken a medication for this, but he has iced it from time to time. No specific injury history, but he plays a lot of tennis (he is left handed) and he works out at [employer]    Review of Systems  Constitutional: Negative.   HENT: Negative.    Eyes: Negative.   Respiratory: Negative.    Cardiovascular: Negative.   Gastrointestinal: Negative.   Genitourinary: Negative.   Musculoskeletal:  Positive for arthralgias.  Skin: Negative.   Neurological: Negative.   Psychiatric/Behavioral: Negative.         Objective:   Physical Exam Constitutional:      General: He is not in acute distress.    Appearance: Normal appearance. He is well-developed. He is not diaphoretic.  HENT:     Head: Normocephalic and atraumatic.     Right Ear: External ear normal.     Left Ear: External ear normal.     Nose: Nose normal.     Mouth/Throat:     Pharynx: No oropharyngeal exudate.  Eyes:     General: No scleral icterus.       Right eye: No discharge.        Left eye: No discharge.     Conjunctiva/sclera: Conjunctivae normal.     Pupils: Pupils are equal, round, and reactive to light.  Neck:     Thyroid : No thyromegaly.     Vascular: No JVD.     Trachea: No tracheal deviation.  Cardiovascular:     Rate and Rhythm: Normal rate and regular rhythm.     Pulses: Normal pulses.     Heart sounds: Normal heart sounds. No murmur heard.    No friction rub. No gallop.  Pulmonary:     Effort: Pulmonary effort is normal. No respiratory distress.     Breath sounds: Normal breath sounds. No wheezing or rales.  Chest:     Chest wall: No tenderness.  Abdominal:     General: Bowel sounds are normal. There is no distension.     Palpations: Abdomen is soft. There is no  mass.     Tenderness: There is no abdominal tenderness. There is no guarding or rebound.  Genitourinary:    Penis: Normal. No tenderness.      Testes: Normal.     Prostate: Normal.     Rectum: Normal. Guaiac result negative.  Musculoskeletal:        General: No tenderness. Normal range of motion.     Cervical back: Neck supple.     Comments: Left shoulder is not tender and has full ROM  Lymphadenopathy:     Cervical: No cervical adenopathy.  Skin:    General: Skin is warm and dry.     Coloration: Skin is not pale.     Findings: No erythema or rash.  Neurological:     General: No focal deficit present.     Mental Status: He is alert and oriented to person, place, and time.     Cranial Nerves: No cranial nerve deficit.     Motor: No abnormal muscle tone.     Coordination: Coordination normal.     Deep Tendon Reflexes: Reflexes are normal and symmetric. Reflexes normal.  Psychiatric:        Mood and Affect: Mood normal.        Behavior: Behavior normal.        Thought Content: Thought content normal.        Judgment: Judgment normal.           Assessment & Plan:  Well exam. We discussed diet and exercise. Get fasting labs. He is scheduled for a colonoscopy on 03-14-24. Refer to Sports Medicine for the left shoulder pain. Garnette Olmsted, MD

## 2024-03-09 NOTE — Addendum Note (Signed)
 Addended by: LADONNA INOCENTE SAILOR on: 03/09/2024 10:00 AM   Modules accepted: Orders

## 2024-03-14 ENCOUNTER — Encounter: Admitting: Gastroenterology

## 2024-03-17 ENCOUNTER — Other Ambulatory Visit: Payer: Self-pay | Admitting: Family Medicine

## 2024-03-23 DIAGNOSIS — W908XXS Exposure to other nonionizing radiation, sequela: Secondary | ICD-10-CM | POA: Diagnosis not present

## 2024-03-23 DIAGNOSIS — L814 Other melanin hyperpigmentation: Secondary | ICD-10-CM | POA: Diagnosis not present

## 2024-03-23 DIAGNOSIS — D1801 Hemangioma of skin and subcutaneous tissue: Secondary | ICD-10-CM | POA: Diagnosis not present

## 2024-03-23 DIAGNOSIS — L821 Other seborrheic keratosis: Secondary | ICD-10-CM | POA: Diagnosis not present

## 2024-03-23 DIAGNOSIS — L578 Other skin changes due to chronic exposure to nonionizing radiation: Secondary | ICD-10-CM | POA: Diagnosis not present

## 2024-03-23 DIAGNOSIS — L820 Inflamed seborrheic keratosis: Secondary | ICD-10-CM | POA: Diagnosis not present

## 2024-03-23 DIAGNOSIS — D225 Melanocytic nevi of trunk: Secondary | ICD-10-CM | POA: Diagnosis not present

## 2024-04-11 ENCOUNTER — Other Ambulatory Visit: Payer: Self-pay | Admitting: Family Medicine

## 2024-04-12 ENCOUNTER — Encounter: Payer: Self-pay | Admitting: Gastroenterology

## 2024-05-23 ENCOUNTER — Telehealth: Payer: Self-pay | Admitting: Family Medicine

## 2024-05-23 ENCOUNTER — Other Ambulatory Visit: Payer: Self-pay

## 2024-05-23 DIAGNOSIS — R7303 Prediabetes: Secondary | ICD-10-CM

## 2024-05-23 DIAGNOSIS — R972 Elevated prostate specific antigen [PSA]: Secondary | ICD-10-CM

## 2024-05-23 LAB — PSA: PSA: 4.25 ng/mL — ABNORMAL HIGH (ref 0.10–4.00)

## 2024-05-23 NOTE — Telephone Encounter (Signed)
 Done

## 2024-05-24 ENCOUNTER — Ambulatory Visit: Payer: Self-pay | Admitting: Family Medicine

## 2024-05-24 NOTE — Addendum Note (Signed)
 Addended by: JOHNNY SENIOR A on: 05/24/2024 08:30 AM   Modules accepted: Orders

## 2024-05-25 ENCOUNTER — Ambulatory Visit (AMBULATORY_SURGERY_CENTER)

## 2024-05-25 VITALS — Ht 71.0 in | Wt 193.0 lb

## 2024-05-25 DIAGNOSIS — Z860100 Personal history of colon polyps, unspecified: Secondary | ICD-10-CM

## 2024-05-25 NOTE — Progress Notes (Signed)
 RN confirmed patient name, date of birth, and address RN confirmed date and time of procedure RN reviewed and confirmed allergies  RN reviewed and updated current medications; confirmed preferred pharmacy Pt is not on diet pills nor GLP-1 medications Pt is not on blood thinners RN reviewed medical & surgical hx  Pt denies issues with chronic constipation  Diabetic - no No A fib or A flutter No cardiac tests are pending  Pt is not on home 02  No issues known with past sedation with any surgeries or procedures Patient denies ever being told they had issues or difficulty with intubation  Patient unaware of any fh of malignant hyperthermia Ambulates independently RN reviewed prep instructions and explained time frames for holding certain medications RN answered patient questions; patient stated understanding Prep instructions sent

## 2024-05-31 ENCOUNTER — Encounter: Payer: Self-pay | Admitting: Family Medicine

## 2024-05-31 DIAGNOSIS — R972 Elevated prostate specific antigen [PSA]: Secondary | ICD-10-CM

## 2024-06-01 ENCOUNTER — Encounter: Payer: Self-pay | Admitting: Gastroenterology

## 2024-06-08 ENCOUNTER — Encounter: Payer: Self-pay | Admitting: Gastroenterology

## 2024-06-08 ENCOUNTER — Ambulatory Visit: Admitting: Gastroenterology

## 2024-06-08 VITALS — BP 120/72 | HR 57 | Temp 97.4°F | Resp 15 | Ht 71.0 in | Wt 193.0 lb

## 2024-06-08 DIAGNOSIS — Z860100 Personal history of colon polyps, unspecified: Secondary | ICD-10-CM

## 2024-06-08 DIAGNOSIS — Z1211 Encounter for screening for malignant neoplasm of colon: Secondary | ICD-10-CM | POA: Diagnosis not present

## 2024-06-08 DIAGNOSIS — K648 Other hemorrhoids: Secondary | ICD-10-CM

## 2024-06-08 DIAGNOSIS — D123 Benign neoplasm of transverse colon: Secondary | ICD-10-CM

## 2024-06-08 DIAGNOSIS — Z860101 Personal history of adenomatous and serrated colon polyps: Secondary | ICD-10-CM

## 2024-06-08 DIAGNOSIS — K635 Polyp of colon: Secondary | ICD-10-CM | POA: Diagnosis not present

## 2024-06-08 DIAGNOSIS — K5730 Diverticulosis of large intestine without perforation or abscess without bleeding: Secondary | ICD-10-CM | POA: Diagnosis not present

## 2024-06-08 DIAGNOSIS — D125 Benign neoplasm of sigmoid colon: Secondary | ICD-10-CM

## 2024-06-08 DIAGNOSIS — Q439 Congenital malformation of intestine, unspecified: Secondary | ICD-10-CM

## 2024-06-08 MED ORDER — SODIUM CHLORIDE 0.9 % IV SOLN: 500.0000 mL | Freq: Once | INTRAVENOUS | Status: DC

## 2024-06-08 NOTE — Progress Notes (Signed)
 [facility] History and Physical   Primary Care Physician:  Anthony Garnette LABOR, MD   Reason for Procedure:   History of colon polyps  Plan:    colonoscopy     HPI: Anthony Ayers is a 54 y.o. male  here for colonoscopy surveillance - last exam 07/2019 - 4 adenomas removed.   Patient denies any bowel symptoms at this time. No family history of colon cancer known. Otherwise feels well without any cardiopulmonary symptoms.   I have discussed risks / benefits of anesthesia and endoscopic procedure with Anthony Ayers and they wish to proceed with the exams as outlined today.   The patient was provided an opportunity to ask questions and all were answered. The patient agreed with the plan.    Past Medical History:  Diagnosis Date   Cataract    left   ED (erectile dysfunction)    Hyperlipidemia     Past Surgical History:  Procedure Laterality Date   COLONOSCOPY  08/11/2019   per Dr. Leigh, adenomatous polyps, repeat in 3 yrs    ESOPHAGEAL DILATION  2008   times 2    EYE SURGERY Left    laser   VASECTOMY     WISDOM TOOTH EXTRACTION      Prior to Admission medications  Medication Sig Start Date End Date Taking? Authorizing Provider  atorvastatin  (LIPITOR) 20 MG tablet Take 1 tablet (20 mg total) by mouth daily. 02/24/24  Yes Anthony Garnette LABOR, MD  Testosterone  20.25 MG/ACT (1.62%) GEL APPLY 4 PUMP ACTUATIONS TOPICALLY ONCE DAILY IN THE MORNING TO THE SHOULDERS AND UPPER ARMS AS DIRECTED 03/20/24  Yes Anthony Garnette LABOR, MD  sildenafil  (VIAGRA ) 100 MG tablet TAKE 1 TABLET BY MOUTH DAILY AS NEEDED FOR ERECTILE DYSFUNCTION 04/12/24   Anthony Garnette LABOR, MD    Current Outpatient Medications  Medication Sig Dispense Refill   atorvastatin  (LIPITOR) 20 MG tablet Take 1 tablet (20 mg total) by mouth daily. 90 tablet 0   Testosterone  20.25 MG/ACT (1.62%) GEL APPLY 4 PUMP ACTUATIONS TOPICALLY ONCE DAILY IN THE MORNING TO THE SHOULDERS AND UPPER ARMS AS DIRECTED 75 g 5    sildenafil  (VIAGRA ) 100 MG tablet TAKE 1 TABLET BY MOUTH DAILY AS NEEDED FOR ERECTILE DYSFUNCTION 10 tablet 0   Current Facility-Administered Medications  Medication Dose Route Frequency Provider Last Rate Last Admin   0.9 %  sodium chloride  infusion  500 mL Intravenous Once Anthony Ayers, Anthony SQUIBB, MD        Allergies as of 06/08/2024 - Review Complete 06/08/2024  Allergen Reaction Noted   Ibuprofen Rash 05/08/2014    Family History  Problem Relation Age of Onset   Colon cancer Neg Hx    Rectal cancer Neg Hx    Stomach cancer Neg Hx    Esophageal cancer Neg Hx    Colon polyps Neg Hx     Social History   Socioeconomic History   Marital status: Married    Spouse name: Not on file   Number of children: Not on file   Years of education: Not on file   Highest education level: Professional school degree (e.g., MD, DDS, DVM, JD)  Occupational History   Not on file  Tobacco Use   Smoking status: Former    Types: Cigarettes   Smokeless tobacco: Never   Tobacco comments:    social smoker  Vaping Use   Vaping status: Never Used  Substance and Sexual Activity   Alcohol use: Not Currently  Comment: socially   Drug use: No   Sexual activity: Yes  Other Topics Concern   Not on file  Social History Narrative   Not on file   Social Drivers of Health   Tobacco Use: Medium Risk (06/08/2024)   Patient History    Smoking Tobacco Use: Former    Smokeless Tobacco Use: Never    Passive Exposure: Not on file  Financial Resource Strain: Low Risk (03/08/2024)   Overall Financial Resource Strain (CARDIA)    Difficulty of Paying Living Expenses: Not hard at all  Food Insecurity: No Food Insecurity (03/08/2024)   Epic    Worried About Radiation Protection Practitioner of Food in the Last Year: Never true    Ran Out of Food in the Last Year: Never true  Transportation Needs: No Transportation Needs (03/08/2024)   Epic    Lack of Transportation (Medical): No    Lack of Transportation (Non-Medical): No   Physical Activity: Sufficiently Active (03/08/2024)   Exercise Vital Sign    Days of Exercise per Week: 5 days    Minutes of Exercise per Session: 60 min  Stress: No Stress Concern Present (03/08/2024)   [employer] of Occupational Health - Occupational Stress Questionnaire    Feeling of Stress: Not at all  Social Connections: Socially Integrated (03/08/2024)   Social Connection and Isolation Panel    Frequency of Communication with Friends and Family: More than three times a week    Frequency of Social Gatherings with Friends and Family: More than three times a week    Attends Religious Services: More than 4 times per year    Active Member of Clubs or Organizations: Yes    Attends Banker Meetings: More than 4 times per year    Marital Status: Married  Catering Manager Violence: Not on file  Depression (PHQ2-9): Low Risk (02/08/2023)   Depression (PHQ2-9)    PHQ-2 Score: 0  Alcohol Screen: Low Risk (03/08/2024)   Alcohol Screen    Last Alcohol Screening Score (AUDIT): 1  Housing: Low Risk (03/08/2024)   Epic    Unable to Pay for Housing in the Last Year: No    Number of Times Moved in the Last Year: 0    Homeless in the Last Year: No  Utilities: Not on file  Health Literacy: Not on file    Review of Systems: All other review of systems negative except as mentioned in the HPI.  Physical Exam: Vital signs BP (!) 146/90   Pulse 63   Temp (!) 97.4 F (36.3 C) (Skin)   Ht 5' 11 (1.803 m)   Wt 193 lb (87.5 kg)   SpO2 100%   BMI 26.92 kg/m   General:   Alert,  Well-developed, pleasant and cooperative in NAD Lungs:  Clear throughout to auscultation.   Heart:  Regular rate and rhythm Abdomen:  Soft, nontender and nondistended.   Neuro/Psych:  Alert and cooperative. Normal mood and affect. A and O x 3  Anthony Naval, MD [facility]

## 2024-06-08 NOTE — Op Note (Signed)
 [facility] Patient Name: Anthony Ayers Procedure Date: 06/08/2024 10:23 AM MRN: [id number] Endoscopist: Elspeth P. Leigh , MD, [id number] Age: 54 Referring MD:  Date of Birth: [birth year] Gender: Male Account #: [id number] Procedure:                Colonoscopy Indications:              High risk colon cancer surveillance: Personal                            history of colonic polyps - last exam 07/2019 - 4                            adenomas Medicines:                Monitored Anesthesia Care Procedure:                Pre-Anesthesia Assessment:                           - Prior to the procedure, a History and Physical                            was performed, and patient medications and                            allergies were reviewed. The patient's tolerance of                            previous anesthesia was also reviewed. The risks                            and benefits of the procedure and the sedation                            options and risks were discussed with the patient.                            All questions were answered, and informed consent                            was obtained. Prior Anticoagulants: The patient has                            taken no anticoagulant or antiplatelet agents. ASA                            Grade Assessment: II - A patient with mild systemic                            disease. After reviewing the risks and benefits,                            the patient was deemed in satisfactory condition to  undergo the procedure.                           After obtaining informed consent, the colonoscope                            was passed under direct vision. Throughout the                            procedure, the patient's blood pressure, pulse, and                            oxygen saturations were monitored continuously. The                            Olympus Scope SN [id number] was introduced through  the                            anus and advanced to the the cecum, identified by                            appendiceal orifice and ileocecal valve. The                            colonoscopy was performed without difficulty. The                            patient tolerated the procedure well. The quality                            of the bowel preparation was good. The ileocecal                            valve, appendiceal orifice, and rectum were                            photographed. Scope In: 10:37:42 AM Scope Out: 10:57:31 AM Scope Withdrawal Time: 0 hours 12 minutes 51 seconds  Total Procedure Duration: 0 hours 19 minutes 49 seconds  Findings:                 The perianal and digital rectal examinations were                            normal.                           An 7 to 8 mm polyp was found in the transverse                            colon. The polyp was flat. The polyp was removed                            with a cold snare. Resection and retrieval were  complete.                           A 3 to 4 mm polyp was found in the sigmoid colon.                            The polyp was sessile. The polyp was removed with a                            cold snare. Resection and retrieval were complete.                           Multiple diverticula were found in the sigmoid                            colon and transverse colon. The sigmoid colon was                            extremely tortous.                           Internal hemorrhoids were found during retroflexion.                           The exam was otherwise without abnormality. Complications:            No immediate complications. Estimated blood loss:                            Minimal. Estimated Blood Loss:     Estimated blood loss was minimal. Impression:               - One 7 to 8 mm polyp in the transverse colon,                            removed with a cold snare. Resected and  retrieved.                           - One 3 to 4 mm polyp in the sigmoid colon, removed                            with a cold snare. Resected and retrieved.                           - Diverticulosis in the sigmoid colon and in the                            transverse colon.                           - Tortous sigmoid colon.                           - Internal hemorrhoids.                           -  The examination was otherwise normal. Recommendation:           - Patient has a contact number available for                            emergencies. The signs and symptoms of potential                            delayed complications were discussed with the                            patient. Return to normal activities tomorrow.                            Written discharge instructions were provided to the                            patient.                           - Resume previous diet.                           - Continue present medications.                           - Await pathology results. Elspeth P. Christyan Reger, MD 06/08/2024 11:02:38 AM This report has been signed electronically.

## 2024-06-08 NOTE — Patient Instructions (Signed)

## 2024-06-08 NOTE — Progress Notes (Signed)
 Pt's states no medical or surgical changes since previsit or office visit.

## 2024-06-08 NOTE — Progress Notes (Signed)
 Called to room to assist during endoscopic procedure.  Patient ID and intended procedure confirmed with present staff. Received instructions for my participation in the procedure from the performing physician.

## 2024-06-08 NOTE — Progress Notes (Signed)
 Report given to PACU, vss

## 2024-06-09 ENCOUNTER — Telehealth: Payer: Self-pay

## 2024-06-09 NOTE — Telephone Encounter (Signed)
" °  Follow up Call-     06/08/2024    9:46 AM  Call back number  Post procedure Call Back phone  # [phone]  Permission to leave phone message Yes     Patient questions:  Do you have a fever, pain , or abdominal swelling? No. Pain Score  0 *  Have you tolerated food without any problems? Yes.    Have you been able to return to your normal activities? Yes.    Do you have any questions about your discharge instructions: Diet   No. Medications  No. Follow up visit  No.  Do you have questions or concerns about your Care? No.  Actions: * If pain score is 4 or above: No action needed, pain <4.   "

## 2024-06-13 ENCOUNTER — Ambulatory Visit: Payer: Self-pay | Admitting: Gastroenterology

## 2024-06-13 LAB — SURGICAL PATHOLOGY

## 2024-07-17 ENCOUNTER — Ambulatory Visit: Admitting: Urology
